# Patient Record
Sex: Male | Born: 1960 | Race: White | Hispanic: No | Marital: Married | Smoking: Never smoker
Health system: Southern US, Community
[De-identification: ages and names within clinical notes are randomized; demographics above are authoritative.]

## PROBLEM LIST (undated history)

## (undated) DIAGNOSIS — J45909 Unspecified asthma, uncomplicated: Secondary | ICD-10-CM

## (undated) DIAGNOSIS — K200 Eosinophilic esophagitis: Secondary | ICD-10-CM

## (undated) DIAGNOSIS — T7840XA Allergy, unspecified, initial encounter: Secondary | ICD-10-CM

## (undated) DIAGNOSIS — E349 Endocrine disorder, unspecified: Secondary | ICD-10-CM

## (undated) DIAGNOSIS — N529 Male erectile dysfunction, unspecified: Secondary | ICD-10-CM

## (undated) DIAGNOSIS — K219 Gastro-esophageal reflux disease without esophagitis: Secondary | ICD-10-CM

## (undated) DIAGNOSIS — T18128A Food in esophagus causing other injury, initial encounter: Secondary | ICD-10-CM

## (undated) DIAGNOSIS — J302 Other seasonal allergic rhinitis: Secondary | ICD-10-CM

## (undated) DIAGNOSIS — K222 Esophageal obstruction: Secondary | ICD-10-CM

## (undated) DIAGNOSIS — C439 Malignant melanoma of skin, unspecified: Secondary | ICD-10-CM

## (undated) DIAGNOSIS — K228 Other specified diseases of esophagus: Secondary | ICD-10-CM

## (undated) HISTORY — PX: HERNIA REPAIR: SHX51

## (undated) HISTORY — DX: Eosinophilic esophagitis: K20.0

## (undated) HISTORY — PX: TONSILLECTOMY: SUR1361

## (undated) HISTORY — DX: Gastro-esophageal reflux disease without esophagitis: K21.9

## (undated) HISTORY — DX: Malignant melanoma of skin, unspecified: C43.9

## (undated) HISTORY — PX: CARPAL TUNNEL RELEASE: SHX101

## (undated) HISTORY — DX: Allergy, unspecified, initial encounter: T78.40XA

## (undated) HISTORY — DX: Esophageal obstruction: K22.2

## (undated) HISTORY — PX: KNEE ARTHROCENTESIS: SUR44

## (undated) HISTORY — PX: NECK SURGERY: SHX720

## (undated) HISTORY — PX: KNEE ARTHROSCOPY: SUR90

## (undated) HISTORY — PX: NASAL SINUS SURGERY: SHX719

---

## 2013-06-18 HISTORY — PX: COLONOSCOPY: SHX174

## 2015-12-02 ENCOUNTER — Encounter (INDEPENDENT_AMBULATORY_CARE_PROVIDER_SITE_OTHER): Payer: BLUE CROSS/BLUE SHIELD | Admitting: Ophthalmology

## 2015-12-02 DIAGNOSIS — H43813 Vitreous degeneration, bilateral: Secondary | ICD-10-CM

## 2015-12-02 DIAGNOSIS — H2513 Age-related nuclear cataract, bilateral: Secondary | ICD-10-CM | POA: Diagnosis not present

## 2015-12-02 DIAGNOSIS — H33022 Retinal detachment with multiple breaks, left eye: Secondary | ICD-10-CM | POA: Diagnosis not present

## 2015-12-02 DIAGNOSIS — H4312 Vitreous hemorrhage, left eye: Secondary | ICD-10-CM

## 2015-12-30 ENCOUNTER — Ambulatory Visit (INDEPENDENT_AMBULATORY_CARE_PROVIDER_SITE_OTHER): Payer: BLUE CROSS/BLUE SHIELD | Admitting: Ophthalmology

## 2015-12-30 DIAGNOSIS — H33302 Unspecified retinal break, left eye: Secondary | ICD-10-CM

## 2016-05-02 ENCOUNTER — Ambulatory Visit (INDEPENDENT_AMBULATORY_CARE_PROVIDER_SITE_OTHER): Payer: BLUE CROSS/BLUE SHIELD | Admitting: Ophthalmology

## 2016-05-31 ENCOUNTER — Ambulatory Visit (INDEPENDENT_AMBULATORY_CARE_PROVIDER_SITE_OTHER): Payer: BLUE CROSS/BLUE SHIELD | Admitting: Ophthalmology

## 2016-06-20 ENCOUNTER — Ambulatory Visit (INDEPENDENT_AMBULATORY_CARE_PROVIDER_SITE_OTHER): Payer: BLUE CROSS/BLUE SHIELD | Admitting: Ophthalmology

## 2016-07-02 ENCOUNTER — Ambulatory Visit (INDEPENDENT_AMBULATORY_CARE_PROVIDER_SITE_OTHER): Payer: BLUE CROSS/BLUE SHIELD | Admitting: Ophthalmology

## 2016-07-02 DIAGNOSIS — H2513 Age-related nuclear cataract, bilateral: Secondary | ICD-10-CM

## 2016-07-02 DIAGNOSIS — H43813 Vitreous degeneration, bilateral: Secondary | ICD-10-CM

## 2016-07-02 DIAGNOSIS — H33302 Unspecified retinal break, left eye: Secondary | ICD-10-CM | POA: Diagnosis not present

## 2016-09-14 DIAGNOSIS — F419 Anxiety disorder, unspecified: Secondary | ICD-10-CM | POA: Diagnosis not present

## 2016-09-19 DIAGNOSIS — F419 Anxiety disorder, unspecified: Secondary | ICD-10-CM | POA: Diagnosis not present

## 2016-09-25 DIAGNOSIS — F419 Anxiety disorder, unspecified: Secondary | ICD-10-CM | POA: Diagnosis not present

## 2016-10-02 DIAGNOSIS — F419 Anxiety disorder, unspecified: Secondary | ICD-10-CM | POA: Diagnosis not present

## 2016-10-10 DIAGNOSIS — F411 Generalized anxiety disorder: Secondary | ICD-10-CM | POA: Diagnosis not present

## 2016-10-17 DIAGNOSIS — F411 Generalized anxiety disorder: Secondary | ICD-10-CM | POA: Diagnosis not present

## 2016-10-30 DIAGNOSIS — F411 Generalized anxiety disorder: Secondary | ICD-10-CM | POA: Diagnosis not present

## 2016-11-16 DIAGNOSIS — F411 Generalized anxiety disorder: Secondary | ICD-10-CM | POA: Diagnosis not present

## 2016-11-23 DIAGNOSIS — F411 Generalized anxiety disorder: Secondary | ICD-10-CM | POA: Diagnosis not present

## 2016-11-30 DIAGNOSIS — F411 Generalized anxiety disorder: Secondary | ICD-10-CM | POA: Diagnosis not present

## 2016-12-04 ENCOUNTER — Encounter (HOSPITAL_COMMUNITY): Payer: Self-pay | Admitting: Emergency Medicine

## 2016-12-04 ENCOUNTER — Encounter (HOSPITAL_COMMUNITY)
Admission: EM | Disposition: A | Discharge status: Home / Self Care | Payer: Self-pay | Source: Home / Self Care | Attending: Internal Medicine

## 2016-12-04 ENCOUNTER — Inpatient Hospital Stay (HOSPITAL_COMMUNITY)
Admission: EM | Admit: 2016-12-04 | Discharge: 2016-12-07 | DRG: 393 | Disposition: A | Discharge status: Home / Self Care | Payer: BLUE CROSS/BLUE SHIELD | Attending: Internal Medicine | Admitting: Internal Medicine

## 2016-12-04 DIAGNOSIS — K2289 Other specified disease of esophagus: Secondary | ICD-10-CM

## 2016-12-04 DIAGNOSIS — R0789 Other chest pain: Secondary | ICD-10-CM

## 2016-12-04 DIAGNOSIS — Z7951 Long term (current) use of inhaled steroids: Secondary | ICD-10-CM

## 2016-12-04 DIAGNOSIS — D72829 Elevated white blood cell count, unspecified: Secondary | ICD-10-CM | POA: Diagnosis present

## 2016-12-04 DIAGNOSIS — R1310 Dysphagia, unspecified: Secondary | ICD-10-CM

## 2016-12-04 DIAGNOSIS — T18128A Food in esophagus causing other injury, initial encounter: Principal | ICD-10-CM | POA: Diagnosis present

## 2016-12-04 DIAGNOSIS — W44F3XA Food entering into or through a natural orifice, initial encounter: Secondary | ICD-10-CM

## 2016-12-04 DIAGNOSIS — J45909 Unspecified asthma, uncomplicated: Secondary | ICD-10-CM | POA: Diagnosis present

## 2016-12-04 DIAGNOSIS — Z79899 Other long term (current) drug therapy: Secondary | ICD-10-CM

## 2016-12-04 DIAGNOSIS — Z87891 Personal history of nicotine dependence: Secondary | ICD-10-CM

## 2016-12-04 DIAGNOSIS — S27813A Laceration of esophagus (thoracic part), initial encounter: Secondary | ICD-10-CM | POA: Diagnosis present

## 2016-12-04 DIAGNOSIS — K219 Gastro-esophageal reflux disease without esophagitis: Secondary | ICD-10-CM | POA: Diagnosis present

## 2016-12-04 DIAGNOSIS — K2210 Ulcer of esophagus without bleeding: Secondary | ICD-10-CM | POA: Diagnosis not present

## 2016-12-04 DIAGNOSIS — J9811 Atelectasis: Secondary | ICD-10-CM | POA: Diagnosis not present

## 2016-12-04 DIAGNOSIS — R1110 Vomiting, unspecified: Secondary | ICD-10-CM | POA: Diagnosis not present

## 2016-12-04 DIAGNOSIS — K228 Other specified diseases of esophagus: Secondary | ICD-10-CM

## 2016-12-04 DIAGNOSIS — N529 Male erectile dysfunction, unspecified: Secondary | ICD-10-CM | POA: Diagnosis present

## 2016-12-04 DIAGNOSIS — E291 Testicular hypofunction: Secondary | ICD-10-CM | POA: Diagnosis present

## 2016-12-04 DIAGNOSIS — Z880 Allergy status to penicillin: Secondary | ICD-10-CM

## 2016-12-04 DIAGNOSIS — X58XXXA Exposure to other specified factors, initial encounter: Secondary | ICD-10-CM | POA: Diagnosis present

## 2016-12-04 DIAGNOSIS — Z8249 Family history of ischemic heart disease and other diseases of the circulatory system: Secondary | ICD-10-CM

## 2016-12-04 HISTORY — DX: Other seasonal allergic rhinitis: J30.2

## 2016-12-04 HISTORY — DX: Male erectile dysfunction, unspecified: N52.9

## 2016-12-04 HISTORY — PX: ESOPHAGOGASTRODUODENOSCOPY: SHX5428

## 2016-12-04 HISTORY — DX: Other specified diseases of esophagus: K22.8

## 2016-12-04 HISTORY — DX: Food in esophagus causing other injury, initial encounter: T18.128A

## 2016-12-04 HISTORY — DX: Endocrine disorder, unspecified: E34.9

## 2016-12-04 HISTORY — DX: Unspecified asthma, uncomplicated: J45.909

## 2016-12-04 HISTORY — DX: Food entering into or through a natural orifice, initial encounter: W44.F3XA

## 2016-12-04 HISTORY — DX: Other specified disease of esophagus: K22.89

## 2016-12-04 SURGERY — EGD (ESOPHAGOGASTRODUODENOSCOPY)
Anesthesia: Moderate Sedation

## 2016-12-04 MED ORDER — FENTANYL CITRATE (PF) 100 MCG/2ML IJ SOLN
INTRAMUSCULAR | Status: DC | PRN
  Administered 2016-12-04 (×3): 25 ug via INTRAVENOUS

## 2016-12-04 MED ORDER — MIDAZOLAM HCL 10 MG/2ML IJ SOLN
INTRAMUSCULAR | Status: DC | PRN
  Administered 2016-12-04 (×2): 2 mg via INTRAVENOUS
  Administered 2016-12-04: 1 mg via INTRAVENOUS

## 2016-12-04 MED ORDER — DIPHENHYDRAMINE HCL 50 MG/ML IJ SOLN
INTRAMUSCULAR | Status: AC
  Filled 2016-12-04: qty 1

## 2016-12-04 MED ORDER — ONDANSETRON HCL 4 MG/2ML IJ SOLN
4.0000 mg | Freq: Once | INTRAMUSCULAR | Status: AC
  Administered 2016-12-04: 4 mg via INTRAVENOUS
  Filled 2016-12-04: qty 2

## 2016-12-04 MED ORDER — NITROGLYCERIN 0.4 MG SL SUBL
0.4000 mg | SUBLINGUAL_TABLET | Freq: Once | SUBLINGUAL | Status: AC
  Administered 2016-12-04: 0.4 mg via SUBLINGUAL
  Filled 2016-12-04: qty 1

## 2016-12-04 MED ORDER — MIDAZOLAM HCL 5 MG/ML IJ SOLN
INTRAMUSCULAR | Status: AC
  Filled 2016-12-04: qty 2

## 2016-12-04 MED ORDER — HYDROMORPHONE HCL 1 MG/ML IJ SOLN
1.0000 mg | Freq: Once | INTRAMUSCULAR | Status: AC
  Administered 2016-12-04: 1 mg via INTRAVENOUS
  Filled 2016-12-04: qty 1

## 2016-12-04 MED ORDER — FENTANYL CITRATE (PF) 100 MCG/2ML IJ SOLN
INTRAMUSCULAR | Status: AC
  Filled 2016-12-04: qty 2

## 2016-12-04 MED ORDER — GLUCAGON HCL RDNA (DIAGNOSTIC) 1 MG IJ SOLR
1.0000 mg | Freq: Once | INTRAMUSCULAR | Status: AC
  Administered 2016-12-04: 1 mg via INTRAVENOUS
  Filled 2016-12-04: qty 1

## 2016-12-04 NOTE — ED Notes (Signed)
Pt resting quietly at this time

## 2016-12-04 NOTE — ED Notes (Signed)
Pt st's not feeling any better at this time.

## 2016-12-04 NOTE — H&P (Signed)
[facility] History and Physical  Consult from ED MD Dr. Rene Kocher Primary Care Physician:  No primary care provider on file.   Reason for Procedure:   food impaction of esophagus  Plan:    Upper GI endoscopy and remove food impaction question eosinophilic esophagitis as a culprit versus an occult stricture.The risks and benefits as well as alternatives of endoscopic procedure(s) have been discussed and reviewed. All questions answered. The patient agrees to proceed.      HPI: Marcus Watson is a 56 y.o. male in usual state of health until around 6:15 PM he was eating steak at the [employer], and it became lodged in his esophagus. No prior history of food impactions or dysphagia. Some occasional reflux. He did not feel well have a lot of pressure, somebody tried to do the Heimlich a few times but it didn't seem to work. He was having trouble handling his secretions. He came to the emergency room where nitroglycerin and glucagon did not help. When I saw me had persistent sensation of food sitting in his mid to lower sternal area. He was having some pain. He had regurgitated and spit up a little bit of blood at some point. No respiratory distress or fever. No abdominal pain. No back pain. He felt better if he were sitting as opposed to lying back caused lower sternal and epigastric pain. Past Medical History:  Diagnosis Date  . Erectile dysfunction   . Mild asthma   . Seasonal allergies   . Testosterone deficiency     Past Surgical History:  Procedure Laterality Date  . [place]OS[place]  2015   in [place]  . HERNIA REPAIR    . KNEE ARTHROCENTESIS    . TONSILLECTOMY      Prior to Admission medications   Medication Sig Start Date End Date Taking? Authorizing Provider  albuterol (PROVENTIL HFA;VENTOLIN HFA) 108 (90 Base) MCG/ACT inhaler Inhale 1 puff into the lungs as needed for wheezing.  12/28/15 12/27/16 Yes [provider]  budesonide (RHINOCORT AQUA) 32 MCG/ACT nasal  spray Place 1-2 sprays into the nose See admin instructions. Two sprays the morning and one and night 01/10/16  Yes [provider]  cetirizine (ZYRTEC) 10 MG tablet Take 10 mg by mouth at bedtime.    Yes [provider]  Cholecalciferol (VITAMIN D-1000 MAX ST) 1000 units tablet Take 1,000 Units by mouth daily.   Yes [provider]  glucosamine-chondroitin 500-400 MG tablet Take 1 tablet by mouth daily.   Yes [provider]  Ketotifen Fumarate ([employer] ALLERGY EYE DROPS OP) Apply 2 drops to eye as needed. Two drops in both eyes   Yes [provider]  sildenafil (REVATIO) 20 MG tablet Take 20 mg by mouth daily. 11/01/16  Yes [provider]  testosterone cypionate (DEPO-TESTOSTERONE) 200 MG/ML injection Inject 200 mg into the muscle See admin instructions. Every two weeks 09/24/16  Yes [provider]    No current facility-administered medications for this encounter.    Current Outpatient Prescriptions  Medication Sig Dispense Refill  . albuterol (PROVENTIL HFA;VENTOLIN HFA) 108 (90 Base) MCG/ACT inhaler Inhale 1 puff into the lungs as needed for wheezing.     . budesonide (RHINOCORT AQUA) 32 MCG/ACT nasal spray Place 1-2 sprays into the nose See admin instructions. Two sprays the morning and one and night    . cetirizine (ZYRTEC) 10 MG tablet Take 10 mg by mouth at bedtime.     . Cholecalciferol (VITAMIN D-1000 MAX ST) 1000  units tablet Take 1,000 Units by mouth daily.    [name] glucosamine-chondroitin 500-400 MG tablet Take 1 tablet by mouth daily.    [name] Ketotifen Fumarate ([employer] ALLERGY EYE DROPS OP) Apply 2 drops to eye as needed. Two drops in both eyes    . sildenafil (REVATIO) 20 MG tablet Take 20 mg by mouth daily.    [name] testosterone cypionate (DEPO-TESTOSTERONE) 200 MG/ML injection Inject 200 mg into the muscle See admin instructions. Every two weeks      Allergies as of 12/04/2016 - Review Complete 12/04/2016  Allergen Reaction Noted   . Penicillins Anaphylaxis 12/16/2014    History reviewed. No pertinent family history.  Social History   Social History  . Marital status: Unknown    Spouse name: N/A  . Number of children: N/A  . Years of education: N/A   Occupational History  . Not on file.   Social History Main Topics  . Smoking status: Never Smoker  . Smokeless tobacco: Former Systems developer  . Alcohol use Yes     Comment: occassional  . Drug use: No   Social History Narrative   Married - from [place] area - moved to [place] approx 2015 to work for [employer]    Review of Systems: Positive for seaonal allergies and some conjunctivitis All other review of systems negative except as mentioned in the HPI.  Physical Exam: Vital signs in last 24 hours: Temp:  [98.2 F (36.8 C)-98.9 F (37.2 C)] 98.9 F (37.2 C) (06/19 2208) Pulse Rate:  [65-95] 68 (06/19 2235) Resp:  [12-22] 12 (06/19 2235) BP: (111-163)/(74-133) 134/80 (06/19 2235) SpO2:  [92 %-98 %] 94 % (06/19 2235) Weight:  [210 lb (95.3 kg)] 210 lb (95.3 kg) (06/19 2208)   General:   Alert,  Well-developed, well-nourished, pleasant and cooperative in Mild discomfort  Lungs:  Clear throughout to auscultation.  No crepitus. Chest wall was not tender xiphoid nontender. Heart:  Regular rate and rhythm; no murmurs, clicks, rubs,  or gallops. Abdomen:  Soft, nontender and nondistended. Normal bowel sounds.   Neuro/Psych:  Alert and cooperative. Normal mood and affect. A and O x 3   @Stephanine Reas  Simonne Maffucci, MD, [facility] [phone] (pager) 12/04/2016 10:47 PM@

## 2016-12-04 NOTE — ED Triage Notes (Signed)
Per pt, eating steak at a restaurant, felt a piece get stuck. Pt's wife reports pt vomited "multiple" times. Waiter performed heimlich maneuver. Pt reported feeling better when EMS got there, had another episode of vomiting and feeling short of breath PTA.

## 2016-12-04 NOTE — ED Provider Notes (Signed)
[facility] DEPT Provider Note   CSN: [id number] Arrival date & time: 12/04/16  1909     History   Chief Complaint Chief Complaint  Patient presents with  . Choking    HPI Marcus Watson is a 56 y.o. male.  The history is provided by the patient, medical records and the spouse. No language interpreter was used.  Illness  This is a new problem. The current episode started less than 1 hour ago. The problem occurs constantly. The problem has not changed since onset.Pertinent negatives include no chest pain, no abdominal pain and no shortness of breath. Nothing aggravates the symptoms. Nothing relieves the symptoms. Treatments tried: abdominal thrusts. The treatment provided no relief.    Past Medical History:  Diagnosis Date  . Erectile dysfunction   . Esophageal laceration 12/04/2016  . Food impaction of esophagus 12/04/2016  . Mild asthma   . Seasonal allergies   . Testosterone deficiency     Patient Active Problem List   Diagnosis Date Noted  . Other chest pain   . Odynophagia   . Food impaction of esophagus 12/04/2016  . Esophageal laceration 12/04/2016    Past Surgical History:  Procedure Laterality Date  . [place]OSCOPY  2015   [place]  . ESOPHAGOGASTRODUODENOSCOPY N/A 12/04/2016   Procedure: ESOPHAGOGASTRODUODENOSCOPY (EGD);  Surgeon: [name], MD;  Location: [facility] ENDOSCOPY;  Service: Endoscopy;  Laterality: N/A;  . HERNIA REPAIR    . KNEE ARTHROCENTESIS    . TONSILLECTOMY         Home Medications    Prior to Admission medications   Medication Sig Start Date End Date Taking? Authorizing Provider  albuterol (PROVENTIL HFA;VENTOLIN HFA) 108 (90 Base) MCG/ACT inhaler Inhale 1 puff into the lungs as needed for wheezing.  12/28/15 12/27/16 Yes [provider]  budesonide (RHINOCORT AQUA) 32 MCG/ACT nasal spray Place 1-2 sprays into the nose See admin instructions. Two sprays the morning and one and night 01/10/16  Yes [provider]    cetirizine (ZYRTEC) 10 MG tablet Take 10 mg by mouth at bedtime.    Yes [provider]  Cholecalciferol (VITAMIN D-1000 MAX ST) 1000 units tablet Take 1,000 Units by mouth daily.   Yes [provider]  glucosamine-chondroitin 500-400 MG tablet Take 1 tablet by mouth daily.   Yes [provider]  Ketotifen Fumarate ([employer] ALLERGY EYE DROPS OP) Apply 2 drops to eye as needed. Two drops in both eyes   Yes [provider]  sildenafil (REVATIO) 20 MG tablet Take 20 mg by mouth daily. 11/01/16  Yes [provider]  testosterone cypionate (DEPO-TESTOSTERONE) 200 MG/ML injection Inject 200 mg into the muscle See admin instructions. Every two weeks 09/24/16  Yes [provider]    Family History Family History  Problem Relation Age of Onset  . Hypertension Other     Social History Social History  Substance Use Topics  . Smoking status: Never Smoker  . Smokeless tobacco: Former Systems developer  . Alcohol use Yes     Comment: occassional     Allergies   Penicillins   Review of Systems Review of Systems  Constitutional: Negative for chills and fever.  HENT: Negative for ear pain and sore throat.   Eyes: Negative for pain and visual disturbance.  Respiratory: Negative for cough and shortness of breath.   Cardiovascular: Negative for chest pain and palpitations.  Gastrointestinal: Positive for vomiting. Negative for abdominal pain.  Genitourinary: Negative for dysuria and hematuria.  Musculoskeletal: Negative  for arthralgias and back pain.  Skin: Negative for color change and rash.  Neurological: Negative for seizures and syncope.  All other systems reviewed and are negative.    Physical Exam Updated Vital Signs BP (!) 124/59 (BP Location: Right Arm)   Pulse 61   Temp 97.7 F (36.5 C) (Oral)   Resp 18   Ht 6\' 1"  (1.854 m)   Wt 95.3 kg (210 lb)   SpO2 96%   BMI 27.71 kg/m   Physical Exam  Constitutional: He appears well-developed. He  appears distressed (Appears uncomfortable and tense).  HENT:  Head: Normocephalic and atraumatic.  Maintaining airway  Eyes: Conjunctivae are normal.  Neck: Neck supple.  Cardiovascular: Normal rate and regular rhythm.   No murmur heard. Pulmonary/Chest: Effort normal and breath sounds normal. No respiratory distress.  Abdominal: Soft. There is no tenderness.  Musculoskeletal: He exhibits no edema.  Neurological: He is alert. No cranial nerve deficit. Coordination normal.  5/5 motor strength and intact sensation in all extremities. Intact bilateral finger-to-nose coordination  Skin: Skin is warm and dry.  Nursing note and vitals reviewed.    ED Treatments / Results  Labs (all labs ordered are listed, but only abnormal results are displayed) Labs Reviewed  CBC - Abnormal; Notable for the following:       Result Value   WBC 13.1 (*)    All other components within normal limits  BASIC METABOLIC PANEL - Abnormal; Notable for the following:    Glucose, Bld 119 (*)    Calcium 8.4 (*)    All other components within normal limits  BASIC METABOLIC PANEL - Abnormal; Notable for the following:    Glucose, Bld 101 (*)    Calcium 8.3 (*)    Anion gap 4 (*)    All other components within normal limits  GLUCOSE, CAPILLARY - Abnormal; Notable for the following:    Glucose-Capillary 105 (*)    All other components within normal limits  I-STAT CHEM 8, ED - Abnormal; Notable for the following:    Glucose, Bld 116 (*)    Calcium, Ion 1.10 (*)    All other components within normal limits  HIV ANTIBODY (ROUTINE TESTING)  CBC  CBC    EKG  EKG Interpretation  Date/Time:  Tuesday December 04 2016 19:44:14 EDT Ventricular Rate:  88 PR Interval:    QRS Duration: 90 QT Interval:  329 QTC Calculation: 398 R Axis:   63 Text Interpretation:  Sinus rhythm Borderline repolarization abnormality Confirmed by Thayer Jew [phone]) on 12/04/2016 10:56:29 PM       Radiology Ct Chest W  Contrast  Result Date: 12/05/2016 CLINICAL DATA:  Felt a piece of steak get stuck in esophagus. Vomiting and shortness of breath, acute onset. Initial encounter. EXAM: CT CHEST, ABDOMEN, AND PELVIS WITH CONTRAST TECHNIQUE: Multidetector CT imaging of the chest, abdomen and pelvis was performed following the standard protocol during bolus administration of intravenous contrast. CONTRAST:  13mL ISOVUE-300 IOPAMIDOL (ISOVUE-300) INJECTION 61% COMPARISON:  None. FINDINGS: CT CHEST FINDINGS Cardiovascular: The heart is normal in size. The thoracic aorta is grossly unremarkable. The great vessels are within normal limits. No calcific atherosclerotic disease is seen. Mediastinum/Nodes: There is mild wall thickening along the mid to distal esophagus, raising concern for esophagitis or acute inflammation due to vomiting. No mediastinal lymphadenopathy is seen. No pericardial effusion is identified. The visualized portions of the thyroid gland are grossly unremarkable. Lungs/Pleura: Mild bibasilar atelectasis is noted. No pleural effusion or pneumothorax is  seen. No masses are identified. Musculoskeletal: No acute osseous abnormalities are identified. The visualized musculature is unremarkable in appearance. CT ABDOMEN PELVIS FINDINGS Hepatobiliary: The liver is unremarkable in appearance. The gallbladder is unremarkable in appearance. The common bile duct remains normal in caliber. Pancreas: The pancreas is within normal limits. Spleen: The spleen is unremarkable in appearance. Adrenals/Urinary Tract: The adrenal glands are unremarkable in appearance. The kidneys are within normal limits. There is no evidence of hydronephrosis. No renal or ureteral stones are identified. Mild nonspecific perinephric stranding is noted bilaterally. Stomach/Bowel: The stomach is unremarkable in appearance. The small bowel is within normal limits. The appendix is normal in caliber, without evidence of appendicitis. The colon is unremarkable  in appearance. Vascular/Lymphatic: The abdominal aorta is unremarkable in appearance. The inferior vena cava is grossly unremarkable. No retroperitoneal lymphadenopathy is seen. No pelvic sidewall lymphadenopathy is identified. Reproductive: The bladder is mildly distended and grossly unremarkable. The prostate is enlarged, measuring 5.8 cm in transverse dimension. The patient is status post vasectomy. Other: No additional soft tissue abnormalities are seen. Musculoskeletal: No acute osseous abnormalities are identified. Multilevel vacuum phenomenon is noted along the lumbar spine. The visualized musculature is unremarkable in appearance. IMPRESSION: 1. Mild wall thickening along the mid to distal esophagus, raising concern for esophagitis or possibly acute inflammation secondary to recent vomiting. No foreign objects seen within the esophagus. 2. Mild bibasilar atelectasis noted.  Lungs otherwise clear. 3. Enlarged prostate noted. Electronically Signed   By: Garald Balding M.D.   On: 12/05/2016 01:22   Ct Abdomen Pelvis W Contrast  Result Date: 12/05/2016 CLINICAL DATA:  Felt a piece of steak get stuck in esophagus. Vomiting and shortness of breath, acute onset. Initial encounter. EXAM: CT CHEST, ABDOMEN, AND PELVIS WITH CONTRAST TECHNIQUE: Multidetector CT imaging of the chest, abdomen and pelvis was performed following the standard protocol during bolus administration of intravenous contrast. CONTRAST:  111mL ISOVUE-300 IOPAMIDOL (ISOVUE-300) INJECTION 61% COMPARISON:  None. FINDINGS: CT CHEST FINDINGS Cardiovascular: The heart is normal in size. The thoracic aorta is grossly unremarkable. The great vessels are within normal limits. No calcific atherosclerotic disease is seen. Mediastinum/Nodes: There is mild wall thickening along the mid to distal esophagus, raising concern for esophagitis or acute inflammation due to vomiting. No mediastinal lymphadenopathy is seen. No pericardial effusion is identified.  The visualized portions of the thyroid gland are grossly unremarkable. Lungs/Pleura: Mild bibasilar atelectasis is noted. No pleural effusion or pneumothorax is seen. No masses are identified. Musculoskeletal: No acute osseous abnormalities are identified. The visualized musculature is unremarkable in appearance. CT ABDOMEN PELVIS FINDINGS Hepatobiliary: The liver is unremarkable in appearance. The gallbladder is unremarkable in appearance. The common bile duct remains normal in caliber. Pancreas: The pancreas is within normal limits. Spleen: The spleen is unremarkable in appearance. Adrenals/Urinary Tract: The adrenal glands are unremarkable in appearance. The kidneys are within normal limits. There is no evidence of hydronephrosis. No renal or ureteral stones are identified. Mild nonspecific perinephric stranding is noted bilaterally. Stomach/Bowel: The stomach is unremarkable in appearance. The small bowel is within normal limits. The appendix is normal in caliber, without evidence of appendicitis. The colon is unremarkable in appearance. Vascular/Lymphatic: The abdominal aorta is unremarkable in appearance. The inferior vena cava is grossly unremarkable. No retroperitoneal lymphadenopathy is seen. No pelvic sidewall lymphadenopathy is identified. Reproductive: The bladder is mildly distended and grossly unremarkable. The prostate is enlarged, measuring 5.8 cm in transverse dimension. The patient is status post vasectomy. Other: No additional soft tissue  abnormalities are seen. Musculoskeletal: No acute osseous abnormalities are identified. Multilevel vacuum phenomenon is noted along the lumbar spine. The visualized musculature is unremarkable in appearance. IMPRESSION: 1. Mild wall thickening along the mid to distal esophagus, raising concern for esophagitis or possibly acute inflammation secondary to recent vomiting. No foreign objects seen within the esophagus. 2. Mild bibasilar atelectasis noted.  Lungs  otherwise clear. 3. Enlarged prostate noted. Electronically Signed   By: Garald Balding M.D.   On: 12/05/2016 01:22    Procedures Procedures (including critical care time)  Medications Ordered in ED Medications  albuterol (PROVENTIL) (2.5 MG/3ML) 0.083% nebulizer solution 3 mL (not administered)  fluticasone (FLONASE) 50 MCG/ACT nasal spray 1 spray (1 spray Each Nare Not Given 12/05/16 0918)  pantoprazole (PROTONIX) injection 40 mg (40 mg Intravenous Given 12/05/16 2127)  acetaminophen (TYLENOL) tablet 650 mg (not administered)    Or  acetaminophen (TYLENOL) suppository 650 mg (not administered)  ondansetron (ZOFRAN) tablet 4 mg (not administered)    Or  ondansetron (ZOFRAN) injection 4 mg (not administered)  dextrose 5 %-0.9 % sodium chloride infusion ( Intravenous New Bag/Given 12/05/16 1606)  HYDROmorphone (DILAUDID) injection 1 mg (1 mg Intravenous Given 12/05/16 2336)  sucralfate (CARAFATE) 1 GM/10ML suspension 1 g (1 g Oral Given 12/05/16 2336)  lidocaine (XYLOCAINE) 2 % viscous mouth solution 15 mL (15 mLs Mouth/Throat Given 12/05/16 2127)  gi cocktail (Maalox,Lidocaine,Donnatal) (30 mLs Oral Given 12/05/16 2127)  glucagon (human recombinant) (GLUCAGEN) injection 1 mg (1 mg Intravenous Given 12/04/16 2007)  HYDROmorphone (DILAUDID) injection 1 mg (1 mg Intravenous Given 12/04/16 2002)  ondansetron (ZOFRAN) injection 4 mg (4 mg Intravenous Given 12/04/16 2001)  nitroGLYCERIN (NITROSTAT) SL tablet 0.4 mg (0.4 mg Sublingual Given 12/04/16 2026)  iopamidol (ISOVUE-300) 61 % injection (100 mLs  Contrast Given 12/05/16 0049)  HYDROmorphone (DILAUDID) injection 1 mg (1 mg Intravenous Given 12/05/16 0329)     Initial Impression / Assessment and Plan / ED Course  I have reviewed the triage vital signs and the nursing notes.  Pertinent labs & imaging results that were available during my care of the patient were reviewed by me and considered in my medical decision making (see chart for  details).     56 year old male previously healthy who presents with suspected food bolus. Eating steak when developed choking episode, acute onset 15 min PTA. Had abdominal thrusts performed without significant improvement. Currently feels impaction sensation in lower esophageal/epigastric region. No previous esophageal issues or surgeries. No previous food bolus impaction.   AF, VSS. Appears in distress on exam and hunched forward in discomfort. Protecting airway. Lungs CTAB.  Zofran, Dilaudid, and Glucagon given without relief. GI consulted and took pt for emergent endoscopy. Endoscopy revealing intimal tear. Pt returned to ED for CT to examine for esophageal perforation.  No evidence of perforation on CT. Pt admitted for further monitoring.  Pt care d/w Dr. Jeneen Rinks  Final Clinical Impressions(s) / ED Diagnoses   Final diagnoses:  Food impaction of esophagus, initial encounter    New Prescriptions Current Discharge Medication List       Payton Emerald, MD 12/06/16 2637    Tanna Furry, MD 12/16/16 2336

## 2016-12-04 NOTE — Op Note (Signed)
[facility] Patient Name: Marcus Watson Procedure Date : 12/04/2016 MRN: [id number] Attending MD: [name] , MD Date of Birth: 08/21/1960 CSN: [id number] Age: 56 Admit Type: Inpatient Procedure:                Upper GI endoscopy Indications:              Foreign body in the esophagus Providers:                [name], MD, Laverta Baltimore, RN, Elspeth Cho, Technician, Alan Mulder, Technician Referring MD:              Medicines:                Fentanyl 75 micrograms IV, Midazolam 5 mg IV Complications:            No immediate complications. Estimated Blood Loss:     Estimated blood loss: none. Procedure:                Pre-Anesthesia Assessment:                           - Prior to the procedure, a History and Physical                            was performed, and patient medications and                            allergies were reviewed. The patient's tolerance of                            previous anesthesia was also reviewed. The risks                            and benefits of the procedure and the sedation                            options and risks were discussed with the patient.                            All questions were answered, and informed consent                            was obtained. Prior Anticoagulants: The patient has                            taken no previous anticoagulant or antiplatelet                            agents. ASA Grade Assessment: II - A patient with                            mild systemic disease. After reviewing the risks  and benefits, the patient was deemed in                            satisfactory condition to undergo the procedure.                           After obtaining informed consent, the endoscope was                            passed under direct vision. Throughout the                            procedure, the patient's blood pressure,  pulse, and                            oxygen saturations were monitored continuously. The                            Endoscope was introduced through the mouth, and                            advanced to the lower third of esophagus. The upper                            GI endoscopy was accomplished without difficulty.                            The patient tolerated the procedure well. Scope In: Scope Out: Findings:      One cratered and linear esophageal ulcer and stigmata of recent bleeding       was found in the distal esophagus. The lesion was 5 mm in largest       dimension. No food in esophagus - ? if it dropped with seddation as he       felt much better after initial dose of sedation      Food (residue) was found in the gastric body. Impression:               - Esophageal ulcer. Deep laceration from retching                            and vomiting -                           - Food (residue) in the stomach.                           - No specimens collected. Moderate Sedation:      Moderate (conscious) sedation was administered by the endoscopy nurse       and supervised by the endoscopist. The following parameters were       monitored: oxygen saturation, heart rate, blood pressure, respiratory       rate, EKG, adequacy of pulmonary ventilation, and response to care.       Total physician intraservice time was 10 minutes. Recommendation:           - Use Protonix (pantoprazole)  40 mg IV BID                            [duration].                           - NPO.                           - Perform CT scan (computed tomography) of the                            chest with contrast today. Evaluate for perforation                            - doubt but possible. Treat w/ abx if shows signs                            of perforation and consult CT surgery if so                           - Admit the patient to hospital ward for                            observation. we will f/u in  AM and see about                            starting diet of clears if CT and clinical                            situation ok                           Keep [facility] raised                           F/U me in office after dc                           - Patient has a contact number available for                            emergencies. The signs and symptoms of potential                            delayed complications were discussed with the                            patient. Return to normal activities tomorrow.                            Written discharge instructions were provided to the                            patient.                           -  Continue present medications once NPO status off Procedure Code(s):        --- Professional ---                           43200, Esophagoscopy, flexible, transoral;                            diagnostic, including collection of specimen(s) by                            brushing or washing, when performed (separate                            procedure)                           99152, Moderate sedation services provided by the                            same physician or other qualified health care                            professional performing the diagnostic or                            therapeutic service that the sedation supports,                            requiring the presence of an independent trained                            observer to assist in the monitoring of the                            patient's level of consciousness and physiological                            status; initial 15 minutes of intraservice time,                            patient age 49 years or older Diagnosis Code(s):        --- Professional ---                           K22.10, Ulcer of esophagus without bleeding                           T18.108A, Unspecified foreign body in esophagus                            causing other injury, initial  encounter CPT copyright 2016 [employer]. All rights reserved. The codes documented in this report are preliminary and upon coder review may  be revised to meet current compliance requirements. [name], MD 12/04/2016 11:04:17 PM This report has been signed electronically. Number of Addenda: 0

## 2016-12-04 NOTE — ED Notes (Signed)
Pt tolerated procedure well.  Wife at bedside.  Pt resting quietly at this time

## 2016-12-04 NOTE — ED Notes (Signed)
Consent signed for Esophagogastroduodenoscopy by pt after procedure explained to pt by Dr. Carlean Purl

## 2016-12-04 NOTE — OR Nursing (Signed)
25 mcg fentanyl wasted by Laverta Baltimore, RN.  Witnessed by Dr. Silvano Rusk.    Laverta Baltimore, RN

## 2016-12-04 NOTE — ED Notes (Signed)
Gastro doctor at bedside at this time

## 2016-12-05 ENCOUNTER — Emergency Department (HOSPITAL_COMMUNITY): Payer: BLUE CROSS/BLUE SHIELD

## 2016-12-05 ENCOUNTER — Encounter (HOSPITAL_COMMUNITY): Payer: Self-pay | Admitting: Internal Medicine

## 2016-12-05 DIAGNOSIS — R1310 Dysphagia, unspecified: Secondary | ICD-10-CM | POA: Diagnosis not present

## 2016-12-05 DIAGNOSIS — J45909 Unspecified asthma, uncomplicated: Secondary | ICD-10-CM | POA: Diagnosis present

## 2016-12-05 DIAGNOSIS — Z79899 Other long term (current) drug therapy: Secondary | ICD-10-CM | POA: Diagnosis not present

## 2016-12-05 DIAGNOSIS — N529 Male erectile dysfunction, unspecified: Secondary | ICD-10-CM | POA: Diagnosis present

## 2016-12-05 DIAGNOSIS — S27813A Laceration of esophagus (thoracic part), initial encounter: Secondary | ICD-10-CM | POA: Diagnosis present

## 2016-12-05 DIAGNOSIS — Z87891 Personal history of nicotine dependence: Secondary | ICD-10-CM | POA: Diagnosis not present

## 2016-12-05 DIAGNOSIS — K2210 Ulcer of esophagus without bleeding: Secondary | ICD-10-CM | POA: Diagnosis present

## 2016-12-05 DIAGNOSIS — E291 Testicular hypofunction: Secondary | ICD-10-CM | POA: Diagnosis present

## 2016-12-05 DIAGNOSIS — Z8249 Family history of ischemic heart disease and other diseases of the circulatory system: Secondary | ICD-10-CM | POA: Diagnosis not present

## 2016-12-05 DIAGNOSIS — T18128A Food in esophagus causing other injury, initial encounter: Secondary | ICD-10-CM | POA: Diagnosis not present

## 2016-12-05 DIAGNOSIS — K228 Other specified diseases of esophagus: Secondary | ICD-10-CM

## 2016-12-05 DIAGNOSIS — Z7951 Long term (current) use of inhaled steroids: Secondary | ICD-10-CM | POA: Diagnosis not present

## 2016-12-05 DIAGNOSIS — J9811 Atelectasis: Secondary | ICD-10-CM | POA: Diagnosis not present

## 2016-12-05 DIAGNOSIS — Z880 Allergy status to penicillin: Secondary | ICD-10-CM | POA: Diagnosis not present

## 2016-12-05 DIAGNOSIS — K219 Gastro-esophageal reflux disease without esophagitis: Secondary | ICD-10-CM | POA: Diagnosis present

## 2016-12-05 DIAGNOSIS — X58XXXA Exposure to other specified factors, initial encounter: Secondary | ICD-10-CM | POA: Diagnosis present

## 2016-12-05 DIAGNOSIS — R0789 Other chest pain: Secondary | ICD-10-CM | POA: Diagnosis not present

## 2016-12-05 DIAGNOSIS — R1110 Vomiting, unspecified: Secondary | ICD-10-CM | POA: Diagnosis not present

## 2016-12-05 DIAGNOSIS — D72829 Elevated white blood cell count, unspecified: Secondary | ICD-10-CM | POA: Diagnosis present

## 2016-12-05 LAB — CBC
HCT: 42.1 % (ref 39.0–52.0)
HCT: 44.1 % (ref 39.0–52.0)
Hemoglobin: 13.4 g/dL (ref 13.0–17.0)
Hemoglobin: 14.4 g/dL (ref 13.0–17.0)
MCH: 29.8 pg (ref 26.0–34.0)
MCH: 30.1 pg (ref 26.0–34.0)
MCHC: 31.8 g/dL (ref 30.0–36.0)
MCHC: 32.7 g/dL (ref 30.0–36.0)
MCV: 92.3 fL (ref 78.0–100.0)
MCV: 93.6 fL (ref 78.0–100.0)
Platelets: 249 10*3/uL (ref 150–400)
Platelets: 252 10*3/uL (ref 150–400)
RBC: 4.5 MIL/uL (ref 4.22–5.81)
RBC: 4.78 MIL/uL (ref 4.22–5.81)
RDW: 13.7 % (ref 11.5–15.5)
RDW: 13.8 % (ref 11.5–15.5)
WBC: 13.1 10*3/uL — ABNORMAL HIGH (ref 4.0–10.5)
WBC: 9.6 10*3/uL (ref 4.0–10.5)

## 2016-12-05 LAB — I-STAT CHEM 8, ED
BUN: 15 mg/dL (ref 6–20)
Calcium, Ion: 1.1 mmol/L — ABNORMAL LOW (ref 1.15–1.40)
Chloride: 105 mmol/L (ref 101–111)
Creatinine, Ser: 1 mg/dL (ref 0.61–1.24)
Glucose, Bld: 116 mg/dL — ABNORMAL HIGH (ref 65–99)
HCT: 41 % (ref 39.0–52.0)
Hemoglobin: 13.9 g/dL (ref 13.0–17.0)
Potassium: 4 mmol/L (ref 3.5–5.1)
Sodium: 140 mmol/L (ref 135–145)
TCO2: 24 mmol/L (ref 0–100)

## 2016-12-05 LAB — BASIC METABOLIC PANEL
Anion gap: 4 — ABNORMAL LOW (ref 5–15)
Anion gap: 5 (ref 5–15)
BUN: 11 mg/dL (ref 6–20)
BUN: 13 mg/dL (ref 6–20)
CO2: 24 mmol/L (ref 22–32)
CO2: 27 mmol/L (ref 22–32)
Calcium: 8.3 mg/dL — ABNORMAL LOW (ref 8.9–10.3)
Calcium: 8.4 mg/dL — ABNORMAL LOW (ref 8.9–10.3)
Chloride: 107 mmol/L (ref 101–111)
Chloride: 109 mmol/L (ref 101–111)
Creatinine, Ser: 1.09 mg/dL (ref 0.61–1.24)
Creatinine, Ser: 1.17 mg/dL (ref 0.61–1.24)
GFR calc Af Amer: >60 mL/min (ref >60)
GFR calc Af Amer: >60 mL/min (ref >60)
GFR calc non Af Amer: >60 mL/min (ref >60)
GFR calc non Af Amer: >60 mL/min (ref >60)
Glucose, Bld: 101 mg/dL — ABNORMAL HIGH (ref 65–99)
Glucose, Bld: 119 mg/dL — ABNORMAL HIGH (ref 65–99)
Potassium: 3.8 mmol/L (ref 3.5–5.1)
Potassium: 3.9 mmol/L (ref 3.5–5.1)
Sodium: 136 mmol/L (ref 135–145)
Sodium: 140 mmol/L (ref 135–145)

## 2016-12-05 LAB — GLUCOSE, CAPILLARY: Glucose-Capillary: 105 mg/dL — ABNORMAL HIGH (ref 65–99)

## 2016-12-05 LAB — HIV ANTIBODY (ROUTINE TESTING W REFLEX): HIV Screen 4th Generation wRfx: NONREACTIVE

## 2016-12-05 MED ORDER — ACETAMINOPHEN 325 MG PO TABS: 650.0000 mg | ORAL_TABLET | Freq: Four times a day (QID) | ORAL | Status: DC | PRN

## 2016-12-05 MED ORDER — LIDOCAINE VISCOUS 2 % MT SOLN
15.0000 mL | Freq: Three times a day (TID) | OROMUCOSAL | Status: DC
  Administered 2016-12-05 – 2016-12-07 (×8): 15 mL via OROMUCOSAL
  Filled 2016-12-05 (×8): qty 15

## 2016-12-05 MED ORDER — ACETAMINOPHEN 650 MG RE SUPP: 650.0000 mg | Freq: Four times a day (QID) | RECTAL | Status: DC | PRN

## 2016-12-05 MED ORDER — HYDROMORPHONE HCL 1 MG/ML IJ SOLN
1.0000 mg | INTRAMUSCULAR | Status: DC | PRN
  Administered 2016-12-05 – 2016-12-06 (×8): 1 mg via INTRAVENOUS
  Filled 2016-12-05 (×8): qty 1

## 2016-12-05 MED ORDER — SUCRALFATE 1 GM/10ML PO SUSP
1.0000 g | Freq: Four times a day (QID) | ORAL | Status: DC
  Administered 2016-12-05 – 2016-12-06 (×5): 1 g via ORAL
  Filled 2016-12-05 (×5): qty 10

## 2016-12-05 MED ORDER — HYDROMORPHONE HCL 1 MG/ML IJ SOLN
1.0000 mg | Freq: Once | INTRAMUSCULAR | Status: AC
  Administered 2016-12-05: 1 mg via INTRAVENOUS
  Filled 2016-12-05: qty 1

## 2016-12-05 MED ORDER — HYDROMORPHONE HCL 1 MG/ML IJ SOLN
1.0000 mg | INTRAMUSCULAR | Status: DC | PRN
  Administered 2016-12-05: 1 mg via INTRAVENOUS
  Filled 2016-12-05: qty 1

## 2016-12-05 MED ORDER — ONDANSETRON HCL 4 MG/2ML IJ SOLN: 4.0000 mg | Freq: Four times a day (QID) | INTRAMUSCULAR | Status: DC | PRN

## 2016-12-05 MED ORDER — PANTOPRAZOLE SODIUM 40 MG IV SOLR
40.0000 mg | Freq: Two times a day (BID) | INTRAVENOUS | Status: DC
  Administered 2016-12-05 – 2016-12-07 (×5): 40 mg via INTRAVENOUS
  Filled 2016-12-05 (×5): qty 40

## 2016-12-05 MED ORDER — IOPAMIDOL (ISOVUE-300) INJECTION 61%
INTRAVENOUS | Status: AC
  Administered 2016-12-05: 100 mL
  Filled 2016-12-05: qty 100

## 2016-12-05 MED ORDER — ALBUTEROL SULFATE (2.5 MG/3ML) 0.083% IN NEBU: 3.0000 mL | INHALATION_SOLUTION | RESPIRATORY_TRACT | Status: DC | PRN

## 2016-12-05 MED ORDER — FLUTICASONE PROPIONATE 50 MCG/ACT NA SUSP
1.0000 | Freq: Every day | NASAL | Status: DC
  Administered 2016-12-06 – 2016-12-07 (×2): 1 via NASAL
  Filled 2016-12-05: qty 16

## 2016-12-05 MED ORDER — GI COCKTAIL ~~LOC~~
30.0000 mL | Freq: Three times a day (TID) | ORAL | Status: DC
  Administered 2016-12-05 – 2016-12-07 (×7): 30 mL via ORAL
  Filled 2016-12-05 (×12): qty 30

## 2016-12-05 MED ORDER — DEXTROSE-NACL 5-0.9 % IV SOLN
INTRAVENOUS | Status: AC
  Administered 2016-12-05 (×2): via INTRAVENOUS

## 2016-12-05 MED ORDER — ONDANSETRON HCL 4 MG PO TABS: 4.0000 mg | ORAL_TABLET | Freq: Four times a day (QID) | ORAL | Status: DC | PRN

## 2016-12-05 NOTE — ED Notes (Signed)
Patient transported to CT 

## 2016-12-05 NOTE — ED Provider Notes (Signed)
Pt seen on arival. Reports choking on steak at restaurant. initailly dyspneic.Waiter gave heimlich. Pt felt better, but felt recurrent nausea and pain.  Pt without improvement after glucagon and SL NTG.   Pt seen by Dr. Carlean Purl and EGD performed. FB into stomach, but intimal esophageal tear noted. CT of chest and abdomen requested.  Pt clinically improved and handling secretions. Will undergo OBS at request of GI physician. Will pursue any abnormalities on chest/abd CT.   Tanna Furry, MD 12/05/16 [phone]

## 2016-12-05 NOTE — H&P (Signed)
History and Physical    Marcus Watson NOB:[id number] DOB: 28-Sep-1960 DOA: 12/04/2016  PCP: No primary care provider on file.  Patient coming from: Home.  Chief Complaint: Chest pain epigastric pain.  HPI: Marcus Watson is a 56 y.o. male with history of nasal allergies and asthma and erectile dysfunction had food stuck in his esophagus and had EGD and by Dr. Carlean Purl and the steak piece was pushed into the stomach. Following which patient had worsening pain and there was concern for laceration.   ED Course: In the ER patient had CT chest and abdomen which shows thickening of the distal esophagus concerning for inflammation and gastroenterologist at this time as requested admission for further observation.  Review of Systems: As per HPI, rest all negative.   Past Medical History:  Diagnosis Date  . Erectile dysfunction   . Esophageal laceration 12/04/2016  . Food impaction of esophagus 12/04/2016  . Mild asthma   . Seasonal allergies   . Testosterone deficiency     Past Surgical History:  Procedure Laterality Date  . [place]OS[place]  2015   in [place]  . HERNIA REPAIR    . KNEE ARTHROCENTESIS    . TONSILLECTOMY       reports that he has never smoked. He has quit using smokeless tobacco. He reports that he drinks alcohol. He reports that he does not use drugs.  Allergies  Allergen Reactions  . Penicillins Anaphylaxis    Has patient had a PCN reaction causing immediate rash, facial/tongue/throat swelling, SOB or lightheadedness with hypotension: No Has patient had a PCN reaction causing severe rash involving mucus membranes or skin necrosis: No Has patient had a PCN reaction that required hospitalization: No Has patient had a PCN reaction occurring within the last 10 years: No If all of the above answers are "NO", then may proceed with Cephalosporin use.    Family History  Problem Relation Age of Onset  . Hypertension Other     Prior to Admission medications   Medication  Sig Start Date End Date Taking? Authorizing Provider  albuterol (PROVENTIL HFA;VENTOLIN HFA) 108 (90 Base) MCG/ACT inhaler Inhale 1 puff into the lungs as needed for wheezing.  12/28/15 12/27/16 Yes [provider]  budesonide (RHINOCORT AQUA) 32 MCG/ACT nasal spray Place 1-2 sprays into the nose See admin instructions. Two sprays the morning and one and night 01/10/16  Yes [provider]  cetirizine (ZYRTEC) 10 MG tablet Take 10 mg by mouth at bedtime.    Yes [provider]  Cholecalciferol (VITAMIN D-1000 MAX ST) 1000 units tablet Take 1,000 Units by mouth daily.   Yes [provider]  glucosamine-chondroitin 500-400 MG tablet Take 1 tablet by mouth daily.   Yes [provider]  Ketotifen Fumarate ([employer] ALLERGY EYE DROPS OP) Apply 2 drops to eye as needed. Two drops in both eyes   Yes [provider]  sildenafil (REVATIO) 20 MG tablet Take 20 mg by mouth daily. 11/01/16  Yes [provider]  testosterone cypionate (DEPO-TESTOSTERONE) 200 MG/ML injection Inject 200 mg into the muscle See admin instructions. Every two weeks 09/24/16  Yes [provider]    Physical Exam: Vitals:   12/05/16 0015 12/05/16 0030 12/05/16 0045 12/05/16 0135  BP: 125/66 124/65 (!) 151/72 124/65  Pulse: 71 67 76 65  Resp: 20 16 14 17   Temp:      TempSrc:      SpO2: 98% 100% 100% 99%  Weight:      Height:  Constitutional: Moderately built and nourished. Vitals:   12/05/16 0015 12/05/16 0030 12/05/16 0045 12/05/16 0135  BP: 125/66 124/65 (!) 151/72 124/65  Pulse: 71 67 76 65  Resp: 20 16 14 17   Temp:      TempSrc:      SpO2: 98% 100% 100% 99%  Weight:      Height:       Eyes: Anicteric no pallor. ENMT: No discharge from the ears eyes nose and mouth. Neck: No neck rigidity no mass felt. Respiratory: No rhonchi or crepitations. Cardiovascular: S1-S2 no murmurs appreciated. Abdomen: Soft nontender bowel sounds  present. Musculoskeletal: No edema. No joint effusion. Skin: No rash or skin appears warm. Neurologic: Alert awake oriented to time place and person. Moves all extremities. Psychiatric: Appears normal. Normal affect.   Labs on Admission: I have personally reviewed following labs and imaging studies  CBC:  Recent Labs Lab 12/05/16 0005 12/05/16 0015  WBC 13.1*  --   HGB 14.4 13.9  HCT 44.1 41.0  MCV 92.3  --   PLT 249  --    Basic Metabolic Panel:  Recent Labs Lab 12/05/16 0005 12/05/16 0015  NA 136 140  K 3.9 4.0  CL 107 105  CO2 24  --   GLUCOSE 119* 116*  BUN 13 15  CREATININE 1.09 1.00  CALCIUM 8.4*  --    GFR: Estimated Creatinine Clearance: 93.2 mL/min (by C-G formula based on SCr of 1 mg/dL). Liver Function Tests: No results for input(s): AST, ALT, ALKPHOS, BILITOT, PROT, ALBUMIN in the last 168 hours. No results for input(s): LIPASE, AMYLASE in the last 168 hours. No results for input(s): AMMONIA in the last 168 hours. Coagulation Profile: No results for input(s): INR, PROTIME in the last 168 hours. Cardiac Enzymes: No results for input(s): CKTOTAL, CKMB, CKMBINDEX, TROPONINI in the last 168 hours. BNP (last 3 results) No results for input(s): PROBNP in the last 8760 hours. HbA1C: No results for input(s): HGBA1C in the last 72 hours. CBG: No results for input(s): GLUCAP in the last 168 hours. Lipid Profile: No results for input(s): CHOL, HDL, LDLCALC, TRIG, CHOLHDL, LDLDIRECT in the last 72 hours. Thyroid Function Tests: No results for input(s): TSH, T4TOTAL, FREET4, T3FREE, THYROIDAB in the last 72 hours. Anemia Panel: No results for input(s): VITAMINB12, FOLATE, FERRITIN, TIBC, IRON, RETICCTPCT in the last 72 hours. Urine analysis: No results found for: COLORURINE, APPEARANCEUR, LABSPEC, PHURINE, GLUCOSEU, HGBUR, BILIRUBINUR, KETONESUR, PROTEINUR, UROBILINOGEN, NITRITE, LEUKOCYTESUR Sepsis Labs: @LABRCNTIP (procalcitonin:4,lacticidven:4) )No  results found for this or any previous visit (from the past 240 hour(s)).   Radiological Exams on Admission: Ct Chest W Contrast  Result Date: 12/05/2016 CLINICAL DATA:  Felt a piece of steak get stuck in esophagus. Vomiting and shortness of breath, acute onset. Initial encounter. EXAM: CT CHEST, ABDOMEN, AND PELVIS WITH CONTRAST TECHNIQUE: Multidetector CT imaging of the chest, abdomen and pelvis was performed following the standard protocol during bolus administration of intravenous contrast. CONTRAST:  178mL ISOVUE-300 IOPAMIDOL (ISOVUE-300) INJECTION 61% COMPARISON:  None. FINDINGS: CT CHEST FINDINGS Cardiovascular: The heart is normal in size. The thoracic aorta is grossly unremarkable. The great vessels are within normal limits. No calcific atherosclerotic disease is seen. Mediastinum/Nodes: There is mild wall thickening along the mid to distal esophagus, raising concern for esophagitis or acute inflammation due to vomiting. No mediastinal lymphadenopathy is seen. No pericardial effusion is identified. The visualized portions of the thyroid gland are grossly unremarkable. Lungs/Pleura: Mild bibasilar atelectasis is noted. No pleural effusion or pneumothorax  is seen. No masses are identified. Musculoskeletal: No acute osseous abnormalities are identified. The visualized musculature is unremarkable in appearance. CT ABDOMEN PELVIS FINDINGS Hepatobiliary: The liver is unremarkable in appearance. The gallbladder is unremarkable in appearance. The common bile duct remains normal in caliber. Pancreas: The pancreas is within normal limits. Spleen: The spleen is unremarkable in appearance. Adrenals/Urinary Tract: The adrenal glands are unremarkable in appearance. The kidneys are within normal limits. There is no evidence of hydronephrosis. No renal or ureteral stones are identified. Mild nonspecific perinephric stranding is noted bilaterally. Stomach/Bowel: The stomach is unremarkable in appearance. The small  bowel is within normal limits. The appendix is normal in caliber, without evidence of appendicitis. The colon is unremarkable in appearance. Vascular/Lymphatic: The abdominal aorta is unremarkable in appearance. The inferior vena cava is grossly unremarkable. No retroperitoneal lymphadenopathy is seen. No pelvic sidewall lymphadenopathy is identified. Reproductive: The bladder is mildly distended and grossly unremarkable. The prostate is enlarged, measuring 5.8 cm in transverse dimension. The patient is status post vasectomy. Other: No additional soft tissue abnormalities are seen. Musculoskeletal: No acute osseous abnormalities are identified. Multilevel vacuum phenomenon is noted along the lumbar spine. The visualized musculature is unremarkable in appearance. IMPRESSION: 1. Mild wall thickening along the mid to distal esophagus, raising concern for esophagitis or possibly acute inflammation secondary to recent vomiting. No foreign objects seen within the esophagus. 2. Mild bibasilar atelectasis noted.  Lungs otherwise clear. 3. Enlarged prostate noted. Electronically Signed   By: Garald Balding M.D.   On: 12/05/2016 01:22   Ct Abdomen Pelvis W Contrast  Result Date: 12/05/2016 CLINICAL DATA:  Felt a piece of steak get stuck in esophagus. Vomiting and shortness of breath, acute onset. Initial encounter. EXAM: CT CHEST, ABDOMEN, AND PELVIS WITH CONTRAST TECHNIQUE: Multidetector CT imaging of the chest, abdomen and pelvis was performed following the standard protocol during bolus administration of intravenous contrast. CONTRAST:  17mL ISOVUE-300 IOPAMIDOL (ISOVUE-300) INJECTION 61% COMPARISON:  None. FINDINGS: CT CHEST FINDINGS Cardiovascular: The heart is normal in size. The thoracic aorta is grossly unremarkable. The great vessels are within normal limits. No calcific atherosclerotic disease is seen. Mediastinum/Nodes: There is mild wall thickening along the mid to distal esophagus, raising concern for  esophagitis or acute inflammation due to vomiting. No mediastinal lymphadenopathy is seen. No pericardial effusion is identified. The visualized portions of the thyroid gland are grossly unremarkable. Lungs/Pleura: Mild bibasilar atelectasis is noted. No pleural effusion or pneumothorax is seen. No masses are identified. Musculoskeletal: No acute osseous abnormalities are identified. The visualized musculature is unremarkable in appearance. CT ABDOMEN PELVIS FINDINGS Hepatobiliary: The liver is unremarkable in appearance. The gallbladder is unremarkable in appearance. The common bile duct remains normal in caliber. Pancreas: The pancreas is within normal limits. Spleen: The spleen is unremarkable in appearance. Adrenals/Urinary Tract: The adrenal glands are unremarkable in appearance. The kidneys are within normal limits. There is no evidence of hydronephrosis. No renal or ureteral stones are identified. Mild nonspecific perinephric stranding is noted bilaterally. Stomach/Bowel: The stomach is unremarkable in appearance. The small bowel is within normal limits. The appendix is normal in caliber, without evidence of appendicitis. The colon is unremarkable in appearance. Vascular/Lymphatic: The abdominal aorta is unremarkable in appearance. The inferior vena cava is grossly unremarkable. No retroperitoneal lymphadenopathy is seen. No pelvic sidewall lymphadenopathy is identified. Reproductive: The bladder is mildly distended and grossly unremarkable. The prostate is enlarged, measuring 5.8 cm in transverse dimension. The patient is status post vasectomy. Other: No additional soft  tissue abnormalities are seen. Musculoskeletal: No acute osseous abnormalities are identified. Multilevel vacuum phenomenon is noted along the lumbar spine. The visualized musculature is unremarkable in appearance. IMPRESSION: 1. Mild wall thickening along the mid to distal esophagus, raising concern for esophagitis or possibly acute  inflammation secondary to recent vomiting. No foreign objects seen within the esophagus. 2. Mild bibasilar atelectasis noted.  Lungs otherwise clear. 3. Enlarged prostate noted. Electronically Signed   By: Garald Balding M.D.   On: 12/05/2016 01:22     Assessment/Plan Principal Problem:   Food impaction of esophagus Active Problems:   Esophageal laceration    1. Food impaction status post removal with pain concerning for laceration in the esophagus - at this time patient is kept nothing by mouth and IV Protonix. Pain relief medications and further recommendations per gastroenterologist. 2. History of allergies and nasal spray. 3. History of erectile dysfunction.   DVT prophylaxis: SCDs. Code Status: Full code.  Family Communication: Patient's wife.  Disposition Plan: Home.  Consults called: Gastroenterologist.  Admission status: Observation.    Rise Patience MD [facility] Pager [phone].  If 7PM-7AM, please contact night-coverage [email or web address] Password [email or web address]  12/05/2016, 2:03 AM

## 2016-12-05 NOTE — Progress Notes (Signed)
Pt seen and examined at bedside, admitted after midnight. Please see earlier admission not by Dr. Hal Hope. Pt admitted after food impaction. EGD performed and intimal esophageal tear noted. GI following ,will follow up on recommendations. For now supportive care.   Faye Ramsay, MD  [facility] Pager [phone]  If 7PM-7AM, please contact night-coverage [email or web address] Password [email or web address]

## 2016-12-05 NOTE — Progress Notes (Signed)
   Telephone f/u - having severe chest pain when swallows secretions. VSS AF  Asking for more frequent Dilaudid.  Chest CT abd/pelvic CT no signs of perforation.  My partner will see him later today and decide next steps but I did increase Dilaudid to q2.  [name], MD, [facility] [phone] (pager) [phone] after 5 PM, weekends and holidays  12/05/2016 7:45 AM

## 2016-12-05 NOTE — Progress Notes (Signed)
Patient ID: Marcus Watson, male   DOB: 12/19/1960, 56 y.o.   MRN: [id number]    Progress Note   Subjective   Sitting up in bed, hurting in lower chest with swallowing, movement- no SOB, no abdominal pain. Dilaudid helping   Objective   Vital signs in last 24 hours: Temp:  [98 F (36.7 C)-98.9 F (37.2 C)] 98 F (36.7 C) (06/20 0303) Pulse Rate:  [60-95] 63 (06/20 0303) Resp:  [12-22] 19 (06/20 0303) BP: (108-163)/(59-133) 117/63 (06/20 0303) SpO2:  [92 %-100 %] 99 % (06/20 0303) Weight:  [210 lb (95.3 kg)] 210 lb (95.3 kg) (06/19 2208) Last BM Date: 12/04/16 General:    white male in NAD, uncomfortable Heart:  Regular rate and rhythm; no murmurs Lungs: Respirations even and unlabored, lungs CTA bilaterally Abdomen:  Soft, nontender and nondistended. Normal bowel sounds. Extremities:  Without edema. Neurologic:  Alert and oriented,  grossly normal neurologically. Psych:  Cooperative. Normal mood and affect.  Intake/Output from previous day: No intake/output data recorded. Intake/Output this shift: No intake/output data recorded.  Lab Results:  Recent Labs  12/05/16 0005 12/05/16 0015 12/05/16 0647  WBC 13.1*  --  9.6  HGB 14.4 13.9 13.4  HCT 44.1 41.0 42.1  PLT 249  --  252   BMET  Recent Labs  12/05/16 0005 12/05/16 0015 12/05/16 0647  NA 136 140 140  K 3.9 4.0 3.8  CL 107 105 109  CO2 24  --  27  GLUCOSE 119* 116* 101*  BUN 13 15 11   CREATININE 1.09 1.00 1.17  CALCIUM 8.4*  --  8.3*   LFT No results for input(s): PROT, ALBUMIN, AST, ALT, ALKPHOS, BILITOT, BILIDIR, IBILI in the last 72 hours. PT/INR No results for input(s): LABPROT, INR in the last 72 hours.  Studies/Results: Ct Chest W Contrast  Result Date: 12/05/2016 CLINICAL DATA:  Felt a piece of steak get stuck in esophagus. Vomiting and shortness of breath, acute onset. Initial encounter. EXAM: CT CHEST, ABDOMEN, AND PELVIS WITH CONTRAST TECHNIQUE: Multidetector CT imaging of the chest,  abdomen and pelvis was performed following the standard protocol during bolus administration of intravenous contrast. CONTRAST:  151mL ISOVUE-300 IOPAMIDOL (ISOVUE-300) INJECTION 61% COMPARISON:  None. FINDINGS: CT CHEST FINDINGS Cardiovascular: The heart is normal in size. The thoracic aorta is grossly unremarkable. The great vessels are within normal limits. No calcific atherosclerotic disease is seen. Mediastinum/Nodes: There is mild wall thickening along the mid to distal esophagus, raising concern for esophagitis or acute inflammation due to vomiting. No mediastinal lymphadenopathy is seen. No pericardial effusion is identified. The visualized portions of the thyroid gland are grossly unremarkable. Lungs/Pleura: Mild bibasilar atelectasis is noted. No pleural effusion or pneumothorax is seen. No masses are identified. Musculoskeletal: No acute osseous abnormalities are identified. The visualized musculature is unremarkable in appearance. CT ABDOMEN PELVIS FINDINGS Hepatobiliary: The liver is unremarkable in appearance. The gallbladder is unremarkable in appearance. The common bile duct remains normal in caliber. Pancreas: The pancreas is within normal limits. Spleen: The spleen is unremarkable in appearance. Adrenals/Urinary Tract: The adrenal glands are unremarkable in appearance. The kidneys are within normal limits. There is no evidence of hydronephrosis. No renal or ureteral stones are identified. Mild nonspecific perinephric stranding is noted bilaterally. Stomach/Bowel: The stomach is unremarkable in appearance. The small bowel is within normal limits. The appendix is normal in caliber, without evidence of appendicitis. The colon is unremarkable in appearance. Vascular/Lymphatic: The abdominal aorta is unremarkable in appearance. The inferior vena cava  is grossly unremarkable. No retroperitoneal lymphadenopathy is seen. No pelvic sidewall lymphadenopathy is identified. Reproductive: The bladder is mildly  distended and grossly unremarkable. The prostate is enlarged, measuring 5.8 cm in transverse dimension. The patient is status post vasectomy. Other: No additional soft tissue abnormalities are seen. Musculoskeletal: No acute osseous abnormalities are identified. Multilevel vacuum phenomenon is noted along the lumbar spine. The visualized musculature is unremarkable in appearance. IMPRESSION: 1. Mild wall thickening along the mid to distal esophagus, raising concern for esophagitis or possibly acute inflammation secondary to recent vomiting. No foreign objects seen within the esophagus. 2. Mild bibasilar atelectasis noted.  Lungs otherwise clear. 3. Enlarged prostate noted. Electronically Signed   By: Garald Balding M.D.   On: 12/05/2016 01:22   Ct Abdomen Pelvis W Contrast  Result Date: 12/05/2016 CLINICAL DATA:  Felt a piece of steak get stuck in esophagus. Vomiting and shortness of breath, acute onset. Initial encounter. EXAM: CT CHEST, ABDOMEN, AND PELVIS WITH CONTRAST TECHNIQUE: Multidetector CT imaging of the chest, abdomen and pelvis was performed following the standard protocol during bolus administration of intravenous contrast. CONTRAST:  174mL ISOVUE-300 IOPAMIDOL (ISOVUE-300) INJECTION 61% COMPARISON:  None. FINDINGS: CT CHEST FINDINGS Cardiovascular: The heart is normal in size. The thoracic aorta is grossly unremarkable. The great vessels are within normal limits. No calcific atherosclerotic disease is seen. Mediastinum/Nodes: There is mild wall thickening along the mid to distal esophagus, raising concern for esophagitis or acute inflammation due to vomiting. No mediastinal lymphadenopathy is seen. No pericardial effusion is identified. The visualized portions of the thyroid gland are grossly unremarkable. Lungs/Pleura: Mild bibasilar atelectasis is noted. No pleural effusion or pneumothorax is seen. No masses are identified. Musculoskeletal: No acute osseous abnormalities are identified. The  visualized musculature is unremarkable in appearance. CT ABDOMEN PELVIS FINDINGS Hepatobiliary: The liver is unremarkable in appearance. The gallbladder is unremarkable in appearance. The common bile duct remains normal in caliber. Pancreas: The pancreas is within normal limits. Spleen: The spleen is unremarkable in appearance. Adrenals/Urinary Tract: The adrenal glands are unremarkable in appearance. The kidneys are within normal limits. There is no evidence of hydronephrosis. No renal or ureteral stones are identified. Mild nonspecific perinephric stranding is noted bilaterally. Stomach/Bowel: The stomach is unremarkable in appearance. The small bowel is within normal limits. The appendix is normal in caliber, without evidence of appendicitis. The colon is unremarkable in appearance. Vascular/Lymphatic: The abdominal aorta is unremarkable in appearance. The inferior vena cava is grossly unremarkable. No retroperitoneal lymphadenopathy is seen. No pelvic sidewall lymphadenopathy is identified. Reproductive: The bladder is mildly distended and grossly unremarkable. The prostate is enlarged, measuring 5.8 cm in transverse dimension. The patient is status post vasectomy. Other: No additional soft tissue abnormalities are seen. Musculoskeletal: No acute osseous abnormalities are identified. Multilevel vacuum phenomenon is noted along the lumbar spine. The visualized musculature is unremarkable in appearance. IMPRESSION: 1. Mild wall thickening along the mid to distal esophagus, raising concern for esophagitis or possibly acute inflammation secondary to recent vomiting. No foreign objects seen within the esophagus. 2. Mild bibasilar atelectasis noted.  Lungs otherwise clear. 3. Enlarged prostate noted. Electronically Signed   By: Garald Balding M.D.   On: 12/05/2016 01:22       Assessment / Plan:    56 yo WM with acute esophageal food impaction-complicated by deep distal esophageal laceration about 5 mm in length   Secondary to vomiting /retching Fortunately CT chest /abd /pelvis  Show no evidence of perforation CBC normal  Plan:  Continue IV fluids IV PPI BID Elevate HOB NPO except ice chips today - do not advance diet until cleared by GI Will start Carafate susp 1 gm QID Start GI cocktail (with lidocaine ) TID  Continue Dilaudid  q 2-3 hours prn  Hopefully will see significant improvement in next 48 hours CBC in am  Principal Problem:   Food impaction of esophagus Active Problems:   Esophageal laceration    LOS: 0 days   Marcus Watson  12/05/2016, 9:55 AM     Attending physician's note   I have taken an interval history, reviewed the chart and examined the patient. I agree with the Advanced Practitioner's note, impression and recommendations. Deep esophageal laceration with chest pain and odynophagia. Mgmt as outlined above. ICe ships as tolerated. Advised pt to stop carafate and GI cocktail if they worsens his symptoms.   Marcus Edward, MD [facility] [phone] Mon-Fri 8a-5p [phone] after 5p, weekends, holidays

## 2016-12-06 LAB — BASIC METABOLIC PANEL
Anion gap: 6 (ref 5–15)
BUN: 7 mg/dL (ref 6–20)
CO2: 27 mmol/L (ref 22–32)
Calcium: 8.4 mg/dL — ABNORMAL LOW (ref 8.9–10.3)
Chloride: 107 mmol/L (ref 101–111)
Creatinine, Ser: 0.97 mg/dL (ref 0.61–1.24)
GFR calc Af Amer: >60 mL/min (ref >60)
GFR calc non Af Amer: >60 mL/min (ref >60)
Glucose, Bld: 113 mg/dL — ABNORMAL HIGH (ref 65–99)
Potassium: 3.7 mmol/L (ref 3.5–5.1)
Sodium: 140 mmol/L (ref 135–145)

## 2016-12-06 LAB — CBC
HCT: 41.3 % (ref 39.0–52.0)
Hemoglobin: 13.3 g/dL (ref 13.0–17.0)
MCH: 30.4 pg (ref 26.0–34.0)
MCHC: 32.2 g/dL (ref 30.0–36.0)
MCV: 94.5 fL (ref 78.0–100.0)
Platelets: 216 10*3/uL (ref 150–400)
RBC: 4.37 MIL/uL (ref 4.22–5.81)
RDW: 13.6 % (ref 11.5–15.5)
WBC: 7.3 10*3/uL (ref 4.0–10.5)

## 2016-12-06 LAB — GLUCOSE, CAPILLARY
Glucose-Capillary: 86 mg/dL (ref 65–99)
Glucose-Capillary: 131 mg/dL — ABNORMAL HIGH (ref 65–99)

## 2016-12-06 MED ORDER — SUCRALFATE 1 GM/10ML PO SUSP
1.0000 g | Freq: Three times a day (TID) | ORAL | Status: DC
  Administered 2016-12-06 – 2016-12-07 (×5): 1 g via ORAL
  Filled 2016-12-06 (×5): qty 10

## 2016-12-06 MED ORDER — TRAMADOL HCL 50 MG PO TABS
100.0000 mg | ORAL_TABLET | Freq: Four times a day (QID) | ORAL | Status: DC | PRN
  Administered 2016-12-07 (×2): 100 mg via ORAL
  Filled 2016-12-06 (×2): qty 2

## 2016-12-06 MED ORDER — KCL IN DEXTROSE-NACL 10-5-0.45 MEQ/L-%-% IV SOLN
INTRAVENOUS | Status: DC
  Administered 2016-12-06 – 2016-12-07 (×4): via INTRAVENOUS
  Filled 2016-12-06 (×7): qty 1000

## 2016-12-06 NOTE — Progress Notes (Addendum)
Patient ID: Marcus Watson, male   DOB: [birth year], 56 y.o.   MRN: [id number]    PROGRESS NOTE    Oskar Cretella  QJJ:[id number] DOB: March 06, 1961 DOA: 12/04/2016  PCP: Patient, No Pcp Per   Outpatient Specialists:   Brief Narrative:  56 y.o. male with history of nasal allergies and asthma and erectile dysfunction had food stuck in his esophagus, had EGD by Dr. Carlean Purl and the steak piece was pushed into the stomach, following which patient had worsening pain and there was concern for laceration.   Assessment & Plan: Esophageal food impaction - complicated by deep distal esophageal laceration about 5 mm in length in the setting of vomiting and retching  - no evidence of perforation on CT chest   - per GI, keep on IVF for now, elevated HOB - GI to advance diet if deemed appropriate  - keep on IV PPI BID - Carafate susp 1 gm QID   DVT prophylaxis: SCD's Code Status: Full  Family Communication: Patient at bedside  Disposition Plan: Home when GI team clears   Consultants:   GI  Procedures:   None  Antimicrobials:   None  Subjective: Pt still with mild soreness in the mid chest area but says overall better.   Objective: Vitals:   12/05/16 0303 12/05/16 1300 12/05/16 2150 12/06/16 0516  BP: 117/63 114/62 (!) 124/59 119/67  Pulse: 63 (!) 56 61 (!) 58  Resp: 19 19 18 18   Temp: 98 F (36.7 C) 98.6 F (37 C) 97.7 F (36.5 C) 98.5 F (36.9 C)  TempSrc: Oral Oral Oral Oral  SpO2: 99% 94% 96% 99%  Weight:      Height:        Intake/Output Summary (Last 24 hours) at 12/06/16 1129 Last data filed at 12/06/16 0516  Gross per 24 hour  Intake          1341.25 ml  Output                0 ml  Net          1341.25 ml   Filed Weights   12/04/16 1920 12/04/16 2208  Weight: 95.3 kg (210 lb) 95.3 kg (210 lb)    Examination:  General exam: Appears calm and comfortable  Respiratory system: Clear to auscultation. Respiratory effort normal. Cardiovascular system: S1 &  S2 heard, RRR. No JVD, murmurs, rubs, gallops or clicks. No pedal edema. Gastrointestinal system: Abdomen is nondistended, soft and nontender. No organomegaly or masses felt. Normal bowel sounds heard. Central nervous system: Alert and oriented. No focal neurological deficits. Extremities: Symmetric 5 x 5 power. Skin: No rashes, lesions or ulcers Psychiatry: Judgement and insight appear normal. Mood & affect appropriate.    Data Reviewed: I have personally reviewed following labs and imaging studies  CBC:  Recent Labs Lab 12/05/16 0005 12/05/16 0015 12/05/16 0647 12/06/16 0904  WBC 13.1*  --  9.6 7.3  HGB 14.4 13.9 13.4 13.3  HCT 44.1 41.0 42.1 41.3  MCV 92.3  --  93.6 94.5  PLT 249  --  252 481   Basic Metabolic Panel:  Recent Labs Lab 12/05/16 0005 12/05/16 0015 12/05/16 0647  NA 136 140 140  K 3.9 4.0 3.8  CL 107 105 109  CO2 24  --  27  GLUCOSE 119* 116* 101*  BUN 13 15 11   CREATININE 1.09 1.00 1.17  CALCIUM 8.4*  --  8.3*   CBG:  Recent Labs Lab 12/05/16 2355 12/06/16 0737  GLUCAP 105* 86   Radiology Studies: Ct Chest W Contrast  Result Date: 12/05/2016 CLINICAL DATA:  Felt a piece of steak get stuck in esophagus. Vomiting and shortness of breath, acute onset. Initial encounter. EXAM: CT CHEST, ABDOMEN, AND PELVIS WITH CONTRAST TECHNIQUE: Multidetector CT imaging of the chest, abdomen and pelvis was performed following the standard protocol during bolus administration of intravenous contrast. CONTRAST:  138mL ISOVUE-300 IOPAMIDOL (ISOVUE-300) INJECTION 61% COMPARISON:  None. FINDINGS: CT CHEST FINDINGS Cardiovascular: The heart is normal in size. The thoracic aorta is grossly unremarkable. The great vessels are within normal limits. No calcific atherosclerotic disease is seen. Mediastinum/Nodes: There is mild wall thickening along the mid to distal esophagus, raising concern for esophagitis or acute inflammation due to vomiting. No mediastinal lymphadenopathy  is seen. No pericardial effusion is identified. The visualized portions of the thyroid gland are grossly unremarkable. Lungs/Pleura: Mild bibasilar atelectasis is noted. No pleural effusion or pneumothorax is seen. No masses are identified. Musculoskeletal: No acute osseous abnormalities are identified. The visualized musculature is unremarkable in appearance. CT ABDOMEN PELVIS FINDINGS Hepatobiliary: The liver is unremarkable in appearance. The gallbladder is unremarkable in appearance. The common bile duct remains normal in caliber. Pancreas: The pancreas is within normal limits. Spleen: The spleen is unremarkable in appearance. Adrenals/Urinary Tract: The adrenal glands are unremarkable in appearance. The kidneys are within normal limits. There is no evidence of hydronephrosis. No renal or ureteral stones are identified. Mild nonspecific perinephric stranding is noted bilaterally. Stomach/Bowel: The stomach is unremarkable in appearance. The small bowel is within normal limits. The appendix is normal in caliber, without evidence of appendicitis. The colon is unremarkable in appearance. Vascular/Lymphatic: The abdominal aorta is unremarkable in appearance. The inferior vena cava is grossly unremarkable. No retroperitoneal lymphadenopathy is seen. No pelvic sidewall lymphadenopathy is identified. Reproductive: The bladder is mildly distended and grossly unremarkable. The prostate is enlarged, measuring 5.8 cm in transverse dimension. The patient is status post vasectomy. Other: No additional soft tissue abnormalities are seen. Musculoskeletal: No acute osseous abnormalities are identified. Multilevel vacuum phenomenon is noted along the lumbar spine. The visualized musculature is unremarkable in appearance. IMPRESSION: 1. Mild wall thickening along the mid to distal esophagus, raising concern for esophagitis or possibly acute inflammation secondary to recent vomiting. No foreign objects seen within the esophagus.  2. Mild bibasilar atelectasis noted.  Lungs otherwise clear. 3. Enlarged prostate noted. Electronically Signed   By: Garald Balding M.D.   On: 12/05/2016 01:22   Ct Abdomen Pelvis W Contrast  Result Date: 12/05/2016 CLINICAL DATA:  Felt a piece of steak get stuck in esophagus. Vomiting and shortness of breath, acute onset. Initial encounter. EXAM: CT CHEST, ABDOMEN, AND PELVIS WITH CONTRAST TECHNIQUE: Multidetector CT imaging of the chest, abdomen and pelvis was performed following the standard protocol during bolus administration of intravenous contrast. CONTRAST:  156mL ISOVUE-300 IOPAMIDOL (ISOVUE-300) INJECTION 61% COMPARISON:  None. FINDINGS: CT CHEST FINDINGS Cardiovascular: The heart is normal in size. The thoracic aorta is grossly unremarkable. The great vessels are within normal limits. No calcific atherosclerotic disease is seen. Mediastinum/Nodes: There is mild wall thickening along the mid to distal esophagus, raising concern for esophagitis or acute inflammation due to vomiting. No mediastinal lymphadenopathy is seen. No pericardial effusion is identified. The visualized portions of the thyroid gland are grossly unremarkable. Lungs/Pleura: Mild bibasilar atelectasis is noted. No pleural effusion or pneumothorax is seen. No masses are identified. Musculoskeletal: No acute osseous abnormalities are identified. The visualized musculature is unremarkable in  appearance. CT ABDOMEN PELVIS FINDINGS Hepatobiliary: The liver is unremarkable in appearance. The gallbladder is unremarkable in appearance. The common bile duct remains normal in caliber. Pancreas: The pancreas is within normal limits. Spleen: The spleen is unremarkable in appearance. Adrenals/Urinary Tract: The adrenal glands are unremarkable in appearance. The kidneys are within normal limits. There is no evidence of hydronephrosis. No renal or ureteral stones are identified. Mild nonspecific perinephric stranding is noted bilaterally.  Stomach/Bowel: The stomach is unremarkable in appearance. The small bowel is within normal limits. The appendix is normal in caliber, without evidence of appendicitis. The colon is unremarkable in appearance. Vascular/Lymphatic: The abdominal aorta is unremarkable in appearance. The inferior vena cava is grossly unremarkable. No retroperitoneal lymphadenopathy is seen. No pelvic sidewall lymphadenopathy is identified. Reproductive: The bladder is mildly distended and grossly unremarkable. The prostate is enlarged, measuring 5.8 cm in transverse dimension. The patient is status post vasectomy. Other: No additional soft tissue abnormalities are seen. Musculoskeletal: No acute osseous abnormalities are identified. Multilevel vacuum phenomenon is noted along the lumbar spine. The visualized musculature is unremarkable in appearance. IMPRESSION: 1. Mild wall thickening along the mid to distal esophagus, raising concern for esophagitis or possibly acute inflammation secondary to recent vomiting. No foreign objects seen within the esophagus. 2. Mild bibasilar atelectasis noted.  Lungs otherwise clear. 3. Enlarged prostate noted. Electronically Signed   By: Garald Balding M.D.   On: 12/05/2016 01:22   Scheduled Meds: . fluticasone  1 spray Each Nare Daily  . gi cocktail  30 mL Oral TID  . lidocaine  15 mL Mouth/Throat TID  . pantoprazole (PROTONIX) IV  40 mg Intravenous Q12H  . sucralfate  1 g Oral Q6H   Continuous Infusions: . dextrose 5 % and 0.45 % NaCl with KCl 10 mEq/L 125 mL/hr at 12/06/16 1006    LOS: 1 day   Time spent: 25 minutes   Faye Ramsay, MD [facility] Pager [phone]  If 7PM-7AM, please contact night-coverage [email or web address] Password [email or web address] 12/06/2016, 11:29 AM

## 2016-12-06 NOTE — Progress Notes (Addendum)
Daily Rounding Note  12/06/2016, 9:17 AM  LOS: 1 day   SUBJECTIVE:   Chief complaint: chest, esophageal pain.  Persists, triggers include twisting, ice chips.  Pain at rest is improved.      Used 8 Mg Dilaudid yesterday, 1 mg so far today and it helps though finds po lidocine gives better relief.   Ice chips only.  Not hungry.   IVFs timed out.    OBJECTIVE:         Vital signs in last 24 hours:    Temp:  [97.7 F (36.5 C)-98.6 F (37 C)] 98.5 F (36.9 C) (06/21 0516) Pulse Rate:  [56-61] 58 (06/21 0516) Resp:  [18-19] 18 (06/21 0516) BP: (114-124)/(59-67) 119/67 (06/21 0516) SpO2:  [94 %-99 %] 99 % (06/21 0516) Last BM Date: 12/04/16 Filed Weights   12/04/16 1920 12/04/16 2208  Weight: 95.3 kg (210 lb) 95.3 kg (210 lb)   General: pleasant,  Looks well Heart: RRR Chest: clear bil.  No labored breathing or cough.  No chest wall tenderness Abdomen: soft, NT,ND.  BS active  Extremities: no CCE Neuro/Psych:  Alert, oriented x 3.  No deficits.  Calm, cooperative.   Intake/Output from previous day: 06/20 0701 - 06/21 0700 In: 1341.3 [I.V.:1341.3] Out: -   Intake/Output this shift: No intake/output data recorded.  Lab Results:  Recent Labs  12/05/16 0005 12/05/16 0015 12/05/16 0647  WBC 13.1*  --  9.6  HGB 14.4 13.9 13.4  HCT 44.1 41.0 42.1  PLT 249  --  252   BMET  Recent Labs  12/05/16 0005 12/05/16 0015 12/05/16 0647  NA 136 140 140  K 3.9 4.0 3.8  CL 107 105 109  CO2 24  --  27  GLUCOSE 119* 116* 101*  BUN 13 15 11   CREATININE 1.09 1.00 1.17  CALCIUM 8.4*  --  8.3*   LFT No results for input(s): PROT, ALBUMIN, AST, ALT, ALKPHOS, BILITOT, BILIDIR, IBILI in the last 72 hours. PT/INR No results for input(s): LABPROT, INR in the last 72 hours. Hepatitis Panel No results for input(s): HEPBSAG, HCVAB, HEPAIGM, HEPBIGM in the last 72 hours.  Studies/Results: Ct Chest W Contrast  Result  Date: 12/05/2016 CLINICAL DATA:  Felt a piece of steak get stuck in esophagus. Vomiting and shortness of breath, acute onset. Initial encounter. EXAM: CT CHEST, ABDOMEN, AND PELVIS WITH CONTRAST TECHNIQUE: Multidetector CT imaging of the chest, abdomen and pelvis was performed following the standard protocol during bolus administration of intravenous contrast. CONTRAST:  129mL ISOVUE-300 IOPAMIDOL (ISOVUE-300) INJECTION 61% COMPARISON:  None. FINDINGS: CT CHEST FINDINGS Cardiovascular: The heart is normal in size. The thoracic aorta is grossly unremarkable. The great vessels are within normal limits. No calcific atherosclerotic disease is seen. Mediastinum/Nodes: There is mild wall thickening along the mid to distal esophagus, raising concern for esophagitis or acute inflammation due to vomiting. No mediastinal lymphadenopathy is seen. No pericardial effusion is identified. The visualized portions of the thyroid gland are grossly unremarkable. Lungs/Pleura: Mild bibasilar atelectasis is noted. No pleural effusion or pneumothorax is seen. No masses are identified. Musculoskeletal: No acute osseous abnormalities are identified. The visualized musculature is unremarkable in appearance. CT ABDOMEN PELVIS FINDINGS Hepatobiliary: The liver is unremarkable in appearance. The gallbladder is unremarkable in appearance. The common bile duct remains normal in caliber. Pancreas: The pancreas is within normal limits. Spleen: The spleen is unremarkable in appearance. Adrenals/Urinary Tract: The adrenal glands are unremarkable in appearance.  The kidneys are within normal limits. There is no evidence of hydronephrosis. No renal or ureteral stones are identified. Mild nonspecific perinephric stranding is noted bilaterally. Stomach/Bowel: The stomach is unremarkable in appearance. The small bowel is within normal limits. The appendix is normal in caliber, without evidence of appendicitis. The colon is unremarkable in appearance.  Vascular/Lymphatic: The abdominal aorta is unremarkable in appearance. The inferior vena cava is grossly unremarkable. No retroperitoneal lymphadenopathy is seen. No pelvic sidewall lymphadenopathy is identified. Reproductive: The bladder is mildly distended and grossly unremarkable. The prostate is enlarged, measuring 5.8 cm in transverse dimension. The patient is status post vasectomy. Other: No additional soft tissue abnormalities are seen. Musculoskeletal: No acute osseous abnormalities are identified. Multilevel vacuum phenomenon is noted along the lumbar spine. The visualized musculature is unremarkable in appearance. IMPRESSION: 1. Mild wall thickening along the mid to distal esophagus, raising concern for esophagitis or possibly acute inflammation secondary to recent vomiting. No foreign objects seen within the esophagus. 2. Mild bibasilar atelectasis noted.  Lungs otherwise clear. 3. Enlarged prostate noted. Electronically Signed   By: Garald Balding M.D.   On: 12/05/2016 01:22   Ct Abdomen Pelvis W Contrast  Result Date: 12/05/2016 CLINICAL DATA:  Felt a piece of steak get stuck in esophagus. Vomiting and shortness of breath, acute onset. Initial encounter. EXAM: CT CHEST, ABDOMEN, AND PELVIS WITH CONTRAST TECHNIQUE: Multidetector CT imaging of the chest, abdomen and pelvis was performed following the standard protocol during bolus administration of intravenous contrast. CONTRAST:  153mL ISOVUE-300 IOPAMIDOL (ISOVUE-300) INJECTION 61% COMPARISON:  None. FINDINGS: CT CHEST FINDINGS Cardiovascular: The heart is normal in size. The thoracic aorta is grossly unremarkable. The great vessels are within normal limits. No calcific atherosclerotic disease is seen. Mediastinum/Nodes: There is mild wall thickening along the mid to distal esophagus, raising concern for esophagitis or acute inflammation due to vomiting. No mediastinal lymphadenopathy is seen. No pericardial effusion is identified. The visualized  portions of the thyroid gland are grossly unremarkable. Lungs/Pleura: Mild bibasilar atelectasis is noted. No pleural effusion or pneumothorax is seen. No masses are identified. Musculoskeletal: No acute osseous abnormalities are identified. The visualized musculature is unremarkable in appearance. CT ABDOMEN PELVIS FINDINGS Hepatobiliary: The liver is unremarkable in appearance. The gallbladder is unremarkable in appearance. The common bile duct remains normal in caliber. Pancreas: The pancreas is within normal limits. Spleen: The spleen is unremarkable in appearance. Adrenals/Urinary Tract: The adrenal glands are unremarkable in appearance. The kidneys are within normal limits. There is no evidence of hydronephrosis. No renal or ureteral stones are identified. Mild nonspecific perinephric stranding is noted bilaterally. Stomach/Bowel: The stomach is unremarkable in appearance. The small bowel is within normal limits. The appendix is normal in caliber, without evidence of appendicitis. The colon is unremarkable in appearance. Vascular/Lymphatic: The abdominal aorta is unremarkable in appearance. The inferior vena cava is grossly unremarkable. No retroperitoneal lymphadenopathy is seen. No pelvic sidewall lymphadenopathy is identified. Reproductive: The bladder is mildly distended and grossly unremarkable. The prostate is enlarged, measuring 5.8 cm in transverse dimension. The patient is status post vasectomy. Other: No additional soft tissue abnormalities are seen. Musculoskeletal: No acute osseous abnormalities are identified. Multilevel vacuum phenomenon is noted along the lumbar spine. The visualized musculature is unremarkable in appearance. IMPRESSION: 1. Mild wall thickening along the mid to distal esophagus, raising concern for esophagitis or possibly acute inflammation secondary to recent vomiting. No foreign objects seen within the esophagus. 2. Mild bibasilar atelectasis noted.  Lungs otherwise clear. 3.  Enlarged prostate noted. Electronically Signed   By: Garald Balding M.D.   On: 12/05/2016 01:22   Scheduled Meds: . fluticasone  1 spray Each Nare Daily  . gi cocktail  30 mL Oral TID  . lidocaine  15 mL Mouth/Throat TID  . pantoprazole (PROTONIX) IV  40 mg Intravenous Q12H  . sucralfate  1 g Oral Q6H   Continuous Infusions: PRN Meds:.acetaminophen **OR** acetaminophen, albuterol, HYDROmorphone (DILAUDID) injection, ondansetron **OR** ondansetron (ZOFRAN) IV   ASSESMENT:   *  Distal esophageal laceration but no perforation in association with food impaction (steak) and retching.  EGD 12/04/16: cratered, linear ulcer with stigmata of bleeding in distal esophagus.  No food in esophagus, but present in stomach so likely food passed prior to EGD.   IV Protonix, Carafate, viscous lidocaine in place.   *  Leukocytosis.  Resolved. No abx on board  PLAN   *   Restart IVF with 10 KCL as I suspect K will become depleted otherwise.  No changes to plan or meds otherwise.     Marcus Watson  12/06/2016, 9:17 AM Pager: [phone]     Attending physician's note   I have taken an interval history, reviewed the chart and examined the patient. I agree with the Advanced Practitioner's note, impression and recommendations. Improving. Clear liquids as tolerated. Trial of Tramadol PO q6h prn instead of IV pain medication prn. Pt advised to use GI cocktail and Carafate suspension before meals to reduce odynophagia, chest pain. If pt tolerates clears would advance to full liquids later today or tomorrow. Anticipate discharge home Friday or Saturday.   Lucio Edward, MD [facility] [phone] Mon-Fri 8a-5p [phone] after 5p, weekends, holidays

## 2016-12-06 NOTE — Progress Notes (Signed)
Patient's diet was advanced to clear liquids. Pt consumed 1 carton of apple juice and chicken broth. Tolerated well, although he states it was slightly painful while swallowing. No choking or nausea noted. Pt currently ambulating around unit.

## 2016-12-07 LAB — GLUCOSE, CAPILLARY
Glucose-Capillary: 116 mg/dL — ABNORMAL HIGH (ref 65–99)
Glucose-Capillary: 100 mg/dL — ABNORMAL HIGH (ref 65–99)
Glucose-Capillary: 86 mg/dL (ref 65–99)

## 2016-12-07 LAB — BASIC METABOLIC PANEL
Anion gap: 7 (ref 5–15)
BUN: 7 mg/dL (ref 6–20)
CO2: 23 mmol/L (ref 22–32)
Calcium: 8.2 mg/dL — ABNORMAL LOW (ref 8.9–10.3)
Chloride: 108 mmol/L (ref 101–111)
Creatinine, Ser: 0.92 mg/dL (ref 0.61–1.24)
GFR calc Af Amer: >60 mL/min (ref >60)
GFR calc non Af Amer: >60 mL/min (ref >60)
Glucose, Bld: 90 mg/dL (ref 65–99)
Potassium: 4.2 mmol/L (ref 3.5–5.1)
Sodium: 138 mmol/L (ref 135–145)

## 2016-12-07 MED ORDER — TRAMADOL HCL 50 MG PO TABS: 100.0000 mg | ORAL_TABLET | Freq: Four times a day (QID) | ORAL | 0 refills | Status: DC | PRN

## 2016-12-07 MED ORDER — GI COCKTAIL ~~LOC~~: 30.0000 mL | Freq: Three times a day (TID) | ORAL | 1 refills | Status: DC

## 2016-12-07 MED ORDER — SUCRALFATE 1 GM/10ML PO SUSP: 1.0000 g | Freq: Three times a day (TID) | ORAL | 0 refills | Status: DC

## 2016-12-07 MED ORDER — PANTOPRAZOLE SODIUM 40 MG PO TBEC: 40.0000 mg | DELAYED_RELEASE_TABLET | Freq: Every day | ORAL | 1 refills | Status: DC

## 2016-12-07 NOTE — Discharge Summary (Signed)
Physician Discharge Summary  Dannel Rafter ZOX:[id number] DOB: 11-25-1960 DOA: 12/04/2016  PCP: Patient, No Pcp Per  Admit date: 12/04/2016 Discharge date: 12/07/2016  Recommendations for Outpatient Follow-up:  1. Pt will need to follow up with Dr. Carlean Purl 6/29 at 10:15 am   Discharge Diagnoses:  Principal Problem:   Food impaction of esophagus Active Problems:   Esophageal laceration   Other chest pain   Odynophagia  Discharge Condition: Stable  Diet recommendation: Full liquid diet   History of present illness:  56 y.o.malewith history of nasal allergies and asthmaand erectile dysfunction had food stuck in his esophagus, had EGD by Dr. Carlean Purl and the steak piecewas pushed into the stomach, following which patient had worsening pain and there was concern for laceration.  Assessment & Plan: Esophageal food impaction - complicated by deep distal esophageal laceration about 5 mm in length in the setting of vomiting and retching  - no evidence of perforation on CT chest - advanced died to full liquids and pt tolerated well - GI cleared for discharge  - continue GI cocktail, tramadol, Protonix QD   - pt to follow up with Dr Carlean Purl    DVT prophylaxis: SCD's Code Status: Full  Family Communication: pt at bedside  Disposition Plan: home   Consultants:   GI  Procedures:   None  Antimicrobials:   None  Procedures/Studies: Ct Chest W Contrast  Result Date: 12/05/2016 CLINICAL DATA:  Felt a piece of steak get stuck in esophagus. Vomiting and shortness of breath, acute onset. Initial encounter. EXAM: CT CHEST, ABDOMEN, AND PELVIS WITH CONTRAST TECHNIQUE: Multidetector CT imaging of the chest, abdomen and pelvis was performed following the standard protocol during bolus administration of intravenous contrast. CONTRAST:  183mL ISOVUE-300 IOPAMIDOL (ISOVUE-300) INJECTION 61% COMPARISON:  None. FINDINGS: CT CHEST FINDINGS Cardiovascular: The heart is normal in  size. The thoracic aorta is grossly unremarkable. The great vessels are within normal limits. No calcific atherosclerotic disease is seen. Mediastinum/Nodes: There is mild wall thickening along the mid to distal esophagus, raising concern for esophagitis or acute inflammation due to vomiting. No mediastinal lymphadenopathy is seen. No pericardial effusion is identified. The visualized portions of the thyroid gland are grossly unremarkable. Lungs/Pleura: Mild bibasilar atelectasis is noted. No pleural effusion or pneumothorax is seen. No masses are identified. Musculoskeletal: No acute osseous abnormalities are identified. The visualized musculature is unremarkable in appearance. CT ABDOMEN PELVIS FINDINGS Hepatobiliary: The liver is unremarkable in appearance. The gallbladder is unremarkable in appearance. The common bile duct remains normal in caliber. Pancreas: The pancreas is within normal limits. Spleen: The spleen is unremarkable in appearance. Adrenals/Urinary Tract: The adrenal glands are unremarkable in appearance. The kidneys are within normal limits. There is no evidence of hydronephrosis. No renal or ureteral stones are identified. Mild nonspecific perinephric stranding is noted bilaterally. Stomach/Bowel: The stomach is unremarkable in appearance. The small bowel is within normal limits. The appendix is normal in caliber, without evidence of appendicitis. The colon is unremarkable in appearance. Vascular/Lymphatic: The abdominal aorta is unremarkable in appearance. The inferior vena cava is grossly unremarkable. No retroperitoneal lymphadenopathy is seen. No pelvic sidewall lymphadenopathy is identified. Reproductive: The bladder is mildly distended and grossly unremarkable. The prostate is enlarged, measuring 5.8 cm in transverse dimension. The patient is status post vasectomy. Other: No additional soft tissue abnormalities are seen. Musculoskeletal: No acute osseous abnormalities are identified.  Multilevel vacuum phenomenon is noted along the lumbar spine. The visualized musculature is unremarkable in appearance. IMPRESSION: 1. Mild wall  thickening along the mid to distal esophagus, raising concern for esophagitis or possibly acute inflammation secondary to recent vomiting. No foreign objects seen within the esophagus. 2. Mild bibasilar atelectasis noted.  Lungs otherwise clear. 3. Enlarged prostate noted. Electronically Signed   By: Garald Balding M.D.   On: 12/05/2016 01:22   Ct Abdomen Pelvis W Contrast  Result Date: 12/05/2016 CLINICAL DATA:  Felt a piece of steak get stuck in esophagus. Vomiting and shortness of breath, acute onset. Initial encounter. EXAM: CT CHEST, ABDOMEN, AND PELVIS WITH CONTRAST TECHNIQUE: Multidetector CT imaging of the chest, abdomen and pelvis was performed following the standard protocol during bolus administration of intravenous contrast. CONTRAST:  166mL ISOVUE-300 IOPAMIDOL (ISOVUE-300) INJECTION 61% COMPARISON:  None. FINDINGS: CT CHEST FINDINGS Cardiovascular: The heart is normal in size. The thoracic aorta is grossly unremarkable. The great vessels are within normal limits. No calcific atherosclerotic disease is seen. Mediastinum/Nodes: There is mild wall thickening along the mid to distal esophagus, raising concern for esophagitis or acute inflammation due to vomiting. No mediastinal lymphadenopathy is seen. No pericardial effusion is identified. The visualized portions of the thyroid gland are grossly unremarkable. Lungs/Pleura: Mild bibasilar atelectasis is noted. No pleural effusion or pneumothorax is seen. No masses are identified. Musculoskeletal: No acute osseous abnormalities are identified. The visualized musculature is unremarkable in appearance. CT ABDOMEN PELVIS FINDINGS Hepatobiliary: The liver is unremarkable in appearance. The gallbladder is unremarkable in appearance. The common bile duct remains normal in caliber. Pancreas: The pancreas is within  normal limits. Spleen: The spleen is unremarkable in appearance. Adrenals/Urinary Tract: The adrenal glands are unremarkable in appearance. The kidneys are within normal limits. There is no evidence of hydronephrosis. No renal or ureteral stones are identified. Mild nonspecific perinephric stranding is noted bilaterally. Stomach/Bowel: The stomach is unremarkable in appearance. The small bowel is within normal limits. The appendix is normal in caliber, without evidence of appendicitis. The colon is unremarkable in appearance. Vascular/Lymphatic: The abdominal aorta is unremarkable in appearance. The inferior vena cava is grossly unremarkable. No retroperitoneal lymphadenopathy is seen. No pelvic sidewall lymphadenopathy is identified. Reproductive: The bladder is mildly distended and grossly unremarkable. The prostate is enlarged, measuring 5.8 cm in transverse dimension. The patient is status post vasectomy. Other: No additional soft tissue abnormalities are seen. Musculoskeletal: No acute osseous abnormalities are identified. Multilevel vacuum phenomenon is noted along the lumbar spine. The visualized musculature is unremarkable in appearance. IMPRESSION: 1. Mild wall thickening along the mid to distal esophagus, raising concern for esophagitis or possibly acute inflammation secondary to recent vomiting. No foreign objects seen within the esophagus. 2. Mild bibasilar atelectasis noted.  Lungs otherwise clear. 3. Enlarged prostate noted. Electronically Signed   By: Garald Balding M.D.   On: 12/05/2016 01:22     Discharge Exam: Vitals:   12/06/16 2135 12/07/16 0629  BP: 127/73 125/69  Pulse: 60 (!) 52  Resp: 18 16  Temp: 98.7 F (37.1 C) 98 F (36.7 C)   Vitals:   12/06/16 0516 12/06/16 1627 12/06/16 2135 12/07/16 0629  BP: 119/67 132/78 127/73 125/69  Pulse: (!) 58 (!) 53 60 (!) 52  Resp: 18  18 16   Temp: 98.5 F (36.9 C) 98.2 F (36.8 C) 98.7 F (37.1 C) 98 F (36.7 C)  TempSrc: Oral  Oral  Oral  SpO2: 99% 97% 100% 99%  Weight:      Height:        General: Pt is alert, follows commands appropriately, not in acute distress  Cardiovascular: Regular rate and rhythm, S1/S2 +, no murmurs, no rubs, no gallops Respiratory: Clear to auscultation bilaterally, no wheezing, no crackles, no rhonchi Abdominal: Soft, non tender, non distended, bowel sounds +, no guarding Extremities: no edema, no cyanosis, pulses palpable bilaterally DP and PT Neuro: Grossly nonfocal  Discharge Instructions  Discharge Instructions    Increase activity slowly    Complete by:  As directed      Allergies as of 12/07/2016      Reactions   Penicillins Anaphylaxis   Has patient had a PCN reaction causing immediate rash, facial/tongue/throat swelling, SOB or lightheadedness with hypotension: No Has patient had a PCN reaction causing severe rash involving mucus membranes or skin necrosis: No Has patient had a PCN reaction that required hospitalization: No Has patient had a PCN reaction occurring within the last 10 years: No If all of the above answers are "NO", then may proceed with Cephalosporin use.      Medication List    TAKE these medications   albuterol 108 (90 Base) MCG/ACT inhaler Commonly known as:  PROVENTIL HFA;VENTOLIN HFA Inhale 1 puff into the lungs as needed for wheezing.   budesonide 32 MCG/ACT nasal spray Commonly known as:  RHINOCORT AQUA Place 1-2 sprays into the nose See admin instructions. Two sprays the morning and one and night   cetirizine 10 MG tablet Commonly known as:  ZYRTEC Take 10 mg by mouth at bedtime.   [employer] ALLERGY EYE DROPS OP Apply 2 drops to eye as needed. Two drops in both eyes   DEPO-TESTOSTERONE 200 MG/ML injection Generic drug:  testosterone cypionate Inject 200 mg into the muscle See admin instructions. Every two weeks   gi cocktail Susp suspension Take 30 mLs by mouth 3 (three) times daily. Each 30 ml dose contains Maalox 12 mL, Viscous Lidocaine 12  mL, Donnatal 6 mL   glucosamine-chondroitin 500-400 MG tablet Take 1 tablet by mouth daily.   pantoprazole 40 MG tablet Commonly known as:  PROTONIX Take 1 tablet (40 mg total) by mouth daily.   sildenafil 20 MG tablet Commonly known as:  REVATIO Take 20 mg by mouth daily.   sucralfate 1 GM/10ML suspension Commonly known as:  CARAFATE Take 10 mLs (1 g total) by mouth 4 (four) times daily -  with meals and at bedtime.   traMADol 50 MG tablet Commonly known as:  ULTRAM Take 2 tablets (100 mg total) by mouth every 6 (six) hours as needed for moderate pain or severe pain.   VITAMIN D-1000 MAX ST 1000 units tablet Generic drug:  Cholecalciferol Take 1,000 Units by mouth daily.      Follow-up Information    [name], MD Follow up.   Specialty:  Gastroenterology Contact information: [address] [place] [address] [phone]        Theodis Blaze, MD Follow up.   Specialty:  Internal Medicine Why:  Please call me with any questions. [phone] Contact information: [address] [place] [address] [phone]            The results of significant diagnostics from this hospitalization (including imaging, microbiology, ancillary and laboratory) are listed below for reference.     Microbiology: No results found for this or any previous visit (from the past 240 hour(s)).   Labs: Basic Metabolic Panel:  Recent Labs Lab 12/05/16 0005 12/05/16 0015 12/05/16 0647 12/06/16 1136 12/07/16 0655  NA 136 140 140 140 138  K 3.9 4.0 3.8  3.7 4.2  CL 107 105 109 107 108  CO2 24  --  27 27 23   GLUCOSE 119* 116* 101* 113* 90  BUN 13 15 11 7 7   CREATININE 1.09 1.00 1.17 0.97 0.92  CALCIUM 8.4*  --  8.3* 8.4* 8.2*   CBC:  Recent Labs Lab 12/05/16 0005 12/05/16 0015 12/05/16 0647 12/06/16 0904  WBC 13.1*  --  9.6 7.3  HGB 14.4 13.9 13.4 13.3  HCT 44.1 41.0 42.1 41.3  MCV 92.3  --  93.6 94.5  PLT 249  --  252 216    CBG:  Recent Labs Lab 12/05/16 2355 12/06/16 0737 12/06/16 1818 12/07/16 0020 12/07/16 0906  GLUCAP 105* 86 131* 100* 116*   SIGNED: Time coordinating discharge:  30 minutes  Faye Ramsay, MD  [facility] 12/07/2016, 12:25 PM Pager [phone]  If 7PM-7AM, please contact night-coverage [email or web address] Password [email or web address]

## 2016-12-07 NOTE — Discharge Instructions (Signed)
Please continue on full liquid diet for at least 48 hours and you can slowly start to advance diet at home as tolerated.    Full Liquid Diet A full liquid diet may be used:  To help you transition from a clear liquid diet to a soft diet.  When your body is healing and can only tolerate foods that are easy to digest.  Before or after certain a procedure, test, or surgery (such as stomach or intestinal surgeries).  If you have trouble swallowing or chewing.  A full liquid diet includes fluids and foods that are liquid or will become liquid at room temperature. The full liquid diet gives you the proteins, fluids, salts, and minerals that you need for energy. If you continue this diet for more than 72 hours, talk to your health care provider about how many calories you need to consume. If you continue the diet for more than 5 days, talk to your health care provider about taking a multivitamin or a nutritional supplement. What do I need to know about a full liquid diet?  You may have any liquid.  You may have any food that becomes a liquid at room temperature. The food is considered a liquid if it can be poured off a spoon at room temperature.  Drink one serving of citrus or vitamin C-enriched fruit juice daily. What foods can I eat? Grains Any grain food that can be pureed in soup (such as crackers, pasta, and rice). Hot cereal (such as farina or oatmeal) that has been blended. Talk to your health care provider or dietitian about these foods. Vegetables Pulp-free tomato or vegetable juice. Vegetables pureed in soup. Fruits Fruit juice, including nectars and juices with pulp. Meats and Other Protein Sources Eggs in custard, eggnog mix, and eggs used in ice cream or pudding. Strained meats, like in baby food, may be allowed. Consult your health care provider. Dairy Milk and milk-based beverages, including milk shakes and instant breakfast mixes. Smooth yogurt. Pureed cottage cheese. Avoid  these foods if they are not well tolerated. Beverages All beverages, including liquid nutritional supplements. Ask your health care provider if you can have carbonated beverages. They may not be well tolerated. Condiments Iodized salt, pepper, spices, and flavorings. Cocoa powder. Vinegar, ketchup, yellow mustard, smooth sauces (such as hollandaise, cheese sauce, or white sauce), and soy sauce. Sweets and Desserts Custard, smooth pudding. Flavored gelatin. Tapioca, junket. Plain ice cream, sherbet, fruit ices. Frozen ice pops, frozen fudge pops, pudding pops, and other frozen bars with cream. Syrups, including chocolate syrup. Sugar, honey, jelly. Fats and Oils Margarine, butter, cream, sour cream, and oils. Other Broth and cream soups. Strained, broth-based soups. The items listed above may not be a complete list of recommended foods or beverages. Contact your dietitian for more options. What foods can I not eat? Grains All breads. Grains are not allowed unless they are pureed into soup. Vegetables Vegetables are not allowed unless they are juiced, or cooked and pureed into soup. Fruits Fruits are not allowed unless they are juiced. Meats and Other Protein Sources Any meat or fish. Cooked or raw eggs. Nut butters. Dairy Cheese. Condiments Stone ground mustards. Fats and Oils Fats that are coarse or chunky. Sweets and Desserts Ice cream or other frozen desserts that have any solids in them or on top, such as nuts, chocolate chips, and pieces of cookies. Cakes. Cookies. Candy. Others Soups with chunks or pieces in them. The items listed above may not be a complete  list of foods and beverages to avoid. Contact your dietitian for more information. This information is not intended to replace advice given to you by your health care provider. Make sure you discuss any questions you have with your health care provider. Document Released: 06/04/2005 Document Revised: 11/10/2015 Document  Reviewed: 04/09/2013 Elsevier Interactive Patient Education  2017 [employer].

## 2016-12-07 NOTE — Progress Notes (Signed)
Patient being d/c home with prescriptions. Verbalize understanding. IV cath removed and intact. No c/o pain at this time or at site. Patient ambulated in hallway and room as ordered. Awaiting transport via outside.

## 2016-12-07 NOTE — Progress Notes (Signed)
Patient ID: Marcus Watson, male   DOB: Nov 09, 1960, 56 y.o.   MRN: [id number]    Progress Note   Subjective  Sitting in chair eating clear liquid breakfast - "getting better slowly but surely" Tolerating clear liquids without difficulty- has not used dilaudid since last night- took Tramadol this am- first time- says GI cocktail helps more than anything    Objective   Vital signs in last 24 hours: Temp:  [98 F (36.7 C)-98.7 F (37.1 C)] 98 F (36.7 C) (06/22 0629) Pulse Rate:  [52-60] 52 (06/22 0629) Resp:  [16-18] 16 (06/22 0629) BP: (125-132)/(69-78) 125/69 (06/22 0629) SpO2:  [97 %-100 %] 99 % (06/22 0629) Last BM Date: 12/04/16   General:    white male  in NAD Heart:  Regular rate and rhythm; no murmurs Lungs: Respirations even and unlabored, lungs CTA bilaterally Abdomen:  Soft, nontender and nondistended. Normal bowel sounds.   Intake/Output from previous day: 06/21 0701 - 06/22 0700 In: 2732.5 [P.O.:120; I.V.:2612.5] Out: -  Intake/Output this shift: No intake/output data recorded.  Lab Results:  Recent Labs  12/05/16 0005 12/05/16 0015 12/05/16 0647 12/06/16 0904  WBC 13.1*  --  9.6 7.3  HGB 14.4 13.9 13.4 13.3  HCT 44.1 41.0 42.1 41.3  PLT 249  --  252 216   BMET  Recent Labs  12/05/16 0647 12/06/16 1136 12/07/16 0655  NA 140 140 138  K 3.8 3.7 4.2  CL 109 107 108  CO2 27 27 23   GLUCOSE 101* 113* 90  BUN 11 7 7   CREATININE 1.17 0.97 0.92  CALCIUM 8.3* 8.4* 8.2*      Assessment / Plan:    #1 56 yo WM with acute esophageal laceration/ulcer secondary to food impaction  No definite stricture on EGD  Pt is stable, no fever, WBC remains normal, negative CT's  Plan: Advance to full liquids - discharge home on full liquids, then gradually advance to soft over next few days at home Discharge GI meds:  GI cocktail 30 cc  30 min AC, and hs, and prn for pain, 10 day supply Tramadol 50 mg q 6 hrs prn, for a few days Protonix 40 mg po  qam  Pt can be discharged home later today, or in am if he requires IV pain meds today Will arrange follow up with Dr Carlean Purl in 10-14 days   Principal Problem:   Food impaction of esophagus Active Problems:   Esophageal laceration   Other chest pain   Odynophagia    LOS: 2 days   Amy Esterwood  12/07/2016, 9:02 AM     Attending physician's note   I have taken an interval history, reviewed the chart and examined the patient. I agree with the Advanced Practitioner's note, impression and recommendations. Odynophagia and chest pain improving steadily. Full liquids today and if he can tolerate this diet he can be discharged, perhaps later today or tomorrow. GI medication and GI follow up as above. GI signing off.   Lucio Edward, MD [facility] [phone] Mon-Fri 8a-5p [phone] after 5p, weekends, holidays

## 2016-12-07 NOTE — Progress Notes (Signed)
Patient ID: Marcus Watson, male   DOB: 1961/06/17, 56 y.o.   MRN: [id number]   Pt has appt with Dr Carlean Purl - GI 6/29 at 10:15am

## 2016-12-08 ENCOUNTER — Telehealth: Payer: Self-pay | Admitting: Gastroenterology

## 2016-12-08 DIAGNOSIS — R1310 Dysphagia, unspecified: Secondary | ICD-10-CM

## 2016-12-08 MED ORDER — LIDOCAINE VISCOUS 2 % MT SOLN: 15.0000 mL | Freq: Four times a day (QID) | OROMUCOSAL | 0 refills | Status: AC | PRN

## 2016-12-08 NOTE — Telephone Encounter (Signed)
On call note. Pt can't obtain GI cocktail says enough donnatal not available at a few pharmacies they tried. DC GI cocktail. Start viscous lidocaine 2% tid ac.

## 2016-12-11 DIAGNOSIS — F411 Generalized anxiety disorder: Secondary | ICD-10-CM | POA: Diagnosis not present

## 2016-12-14 ENCOUNTER — Encounter: Payer: Self-pay | Admitting: Internal Medicine

## 2016-12-14 ENCOUNTER — Ambulatory Visit (INDEPENDENT_AMBULATORY_CARE_PROVIDER_SITE_OTHER): Payer: BLUE CROSS/BLUE SHIELD | Admitting: Internal Medicine

## 2016-12-14 VITALS — BP 120/64 | HR 64 | Ht 71.75 in | Wt 209.0 lb

## 2016-12-14 DIAGNOSIS — K228 Other specified diseases of esophagus: Secondary | ICD-10-CM | POA: Diagnosis not present

## 2016-12-14 DIAGNOSIS — K219 Gastro-esophageal reflux disease without esophagitis: Secondary | ICD-10-CM | POA: Diagnosis not present

## 2016-12-14 DIAGNOSIS — K222 Esophageal obstruction: Secondary | ICD-10-CM

## 2016-12-14 DIAGNOSIS — K2289 Other specified disease of esophagus: Secondary | ICD-10-CM

## 2016-12-14 DIAGNOSIS — T18128A Food in esophagus causing other injury, initial encounter: Secondary | ICD-10-CM | POA: Diagnosis not present

## 2016-12-14 NOTE — Progress Notes (Signed)
Marcus Watson 56 y.o. 04/25/1961 [id number]  Assessment & Plan:   Encounter Diagnoses  Name Primary?  . Esophageal obstruction due to food impaction Yes  . Esophageal laceration   . Gastroesophageal reflux disease, esophagitis presence not specified    Stay on PPI Stop carafate when done 7/2 advance diet EGD August - possible dilation - ? GERD w/ stx vs EoE  The risks and benefits as well as alternatives of endoscopic procedure(s) have been discussed and reviewed. All questions answered. The patient agrees to proceed.     Subjective:   Chief Complaint:Follow-up after esophageal laceration and food impaction  HPI The patient is here, I met him in the emergency department on June 19. He had a food impaction and esophageal laceration associated with that. He was hospitalized for a few days due to severe odynophagia. That is improving. He is still on a soft diet. He is on Carafate and pantoprazole at this point. Prior to this steak food impaction he had been having increasing heartburn though I don't think he was having dysphagia. There is a background of seasonal allergies as well. He is asking if he can return to physical activity and advance his diet. He is feeling well overall and not requiring anything to treat painful swallowing i.e. like lidocaine or EI cocktail. He has a trip to [place] for business coming up soon. Allergies  Allergen Reactions  . Penicillins Anaphylaxis    Has patient had a PCN reaction causing immediate rash, facial/tongue/throat swelling, SOB or lightheadedness with hypotension: No Has patient had a PCN reaction causing severe rash involving mucus membranes or skin necrosis: No Has patient had a PCN reaction that required hospitalization: No Has patient had a PCN reaction occurring within the last 10 years: No If all of the above answers are "NO", then may proceed with Cephalosporin use.   Current Meds  Medication Sig  . albuterol (PROVENTIL  HFA;VENTOLIN HFA) 108 (90 Base) MCG/ACT inhaler Inhale 1 puff into the lungs as needed for wheezing.   . budesonide (RHINOCORT AQUA) 32 MCG/ACT nasal spray Place 1-2 sprays into the nose See admin instructions. Two sprays the morning and one and night  . cetirizine (ZYRTEC) 10 MG tablet Take 10 mg by mouth at bedtime.   . Cholecalciferol (VITAMIN D-1000 MAX ST) 1000 units tablet Take 1,000 Units by mouth daily.  [name] glucosamine-chondroitin 500-400 MG tablet Take 1 tablet by mouth daily.  [name] Ketotifen Fumarate ([facility] ALLERGY EYE DROPS OP) Apply 2 drops to eye as needed. Two drops in both eyes  . pantoprazole (PROTONIX) 40 MG tablet Take 1 tablet (40 mg total) by mouth daily.  . sildenafil (REVATIO) 20 MG tablet Take 20 mg by mouth daily.  . sucralfate (CARAFATE) 1 GM/10ML suspension Take 10 mLs (1 g total) by mouth 4 (four) times daily -  with meals and at bedtime.  [name] testosterone cypionate (DEPO-TESTOSTERONE) 200 MG/ML injection Inject 200 mg into the muscle See admin instructions. Every two weeks   Past Medical History:  Diagnosis Date  . Erectile dysfunction   . Esophageal laceration 12/04/2016  . Food impaction of esophagus 12/04/2016  . Mild asthma   . Seasonal allergies   . Testosterone deficiency    Past Surgical History:  Procedure Laterality Date  . [place]ONOSCOPY  2015   in [place]  . ESOPHAGOGASTRODUODENOSCOPY N/A 12/04/2016   Procedure: ESOPHAGOGASTRODUODENOSCOPY (EGD);  Surgeon: [name], MD;  Location: [facility] ENDOSCOPY;  Service: Endoscopy;  Laterality: N/A;  . HERNIA REPAIR    .  KNEE ARTHROCENTESIS    . TONSILLECTOMY     Social History   Social History  . Marital status: Married    Spouse name: N/A  . Number of children: N/A  . Years of education: N/A   Occupational History  . Not on file.   Social History Main Topics  . Smoking status: Never Smoker  . Smokeless tobacco: Former Systems developer  . Alcohol use Yes     Comment: occassional  . Drug use: No  . Sexual activity: Not on  file   Other Topics Concern  . Not on file   Social History Narrative   Married - from [place] area - moved to [place] approx 2015 to work for [employer]   family history includes Hypertension in his other.   Review of Systems As per history of present illness  Objective:   Physical Exam BP 120/64 (BP Location: Left Arm, Patient Position: Sitting, Cuff Size: Normal)   Pulse 64   Ht 5' 11.75" (1.822 m) Comment: height measured without shoes  Wt 209 lb (94.8 kg)   BMI 28.54 kg/m  NAD   15 minutes time spent with patient > half in counseling coordination of care

## 2016-12-14 NOTE — Patient Instructions (Addendum)
    When carafate runs out - stop. Physical activity is no problem.  Gradually return to normal diet - would start that change on 7/2.  Stay on pantoprazole.  You have been scheduled for an endoscopy. Please follow written instructions given to you at your visit today. If you use inhalers (even only as needed), please bring them with you on the day of your procedure. Your physician has requested that you go to www.startemmi.com and enter the access code given to you at your visit today. This web site gives a general overview about your procedure. However, you should still follow specific instructions given to you by our office regarding your preparation for the procedure.   Normal BMI (Body Mass Index- based on height and weight) is between 19 and 25. Your BMI today is Body mass index is 28.54 kg/m. [name] Please consider follow up  regarding your BMI with your Primary Care Provider.     I appreciate the opportunity to care for you. [name], MD, [facility]

## 2016-12-18 DIAGNOSIS — F411 Generalized anxiety disorder: Secondary | ICD-10-CM | POA: Diagnosis not present

## 2017-01-08 ENCOUNTER — Encounter: Payer: Self-pay | Admitting: Internal Medicine

## 2017-01-15 DIAGNOSIS — F411 Generalized anxiety disorder: Secondary | ICD-10-CM | POA: Diagnosis not present

## 2017-01-16 ENCOUNTER — Ambulatory Visit (AMBULATORY_SURGERY_CENTER): Payer: BLUE CROSS/BLUE SHIELD | Admitting: Internal Medicine

## 2017-01-16 ENCOUNTER — Encounter: Payer: Self-pay | Admitting: Internal Medicine

## 2017-01-16 VITALS — BP 117/76 | HR 53 | Temp 98.4°F | Resp 17 | Ht 71.75 in | Wt 209.0 lb

## 2017-01-16 DIAGNOSIS — K209 Esophagitis, unspecified without bleeding: Secondary | ICD-10-CM

## 2017-01-16 DIAGNOSIS — T18128A Food in esophagus causing other injury, initial encounter: Secondary | ICD-10-CM | POA: Diagnosis not present

## 2017-01-16 DIAGNOSIS — K222 Esophageal obstruction: Secondary | ICD-10-CM | POA: Diagnosis not present

## 2017-01-16 DIAGNOSIS — R1310 Dysphagia, unspecified: Secondary | ICD-10-CM | POA: Diagnosis not present

## 2017-01-16 DIAGNOSIS — K208 Other esophagitis: Secondary | ICD-10-CM | POA: Diagnosis not present

## 2017-01-16 DIAGNOSIS — K200 Eosinophilic esophagitis: Secondary | ICD-10-CM | POA: Diagnosis not present

## 2017-01-16 DIAGNOSIS — R1319 Other dysphagia: Secondary | ICD-10-CM

## 2017-01-16 MED ORDER — SODIUM CHLORIDE 0.9 % IV SOLN: 500.0000 mL | INTRAVENOUS | Status: DC

## 2017-01-16 NOTE — Progress Notes (Signed)
A/ox3 pleased with MAC, report to [facility]

## 2017-01-16 NOTE — Patient Instructions (Addendum)
There was inflammation where the esophagus and stomach meet. I saw changes in the esophagus that suggest a condition called eosinophilic esophagitis.  In eosinophilic esophagitis (e-o-sin-o-FILL-ik uh-sof-uh-JIE-tis), a type of white blood cell (eosinophil) builds up in the lining of the tube that connects your mouth to your stomach (esophagus). This buildup, which is a reaction to foods, allergens or acid reflux, can inflame or injure the esophageal tissue. Damaged esophageal tissue can lead to difficulty swallowing or cause food to get caught when you swallow.  Eosinophilic esophagitis is a chronic immune system disease. It has been identified only in the past two decades, but is now considered a major cause of digestive system (gastrointestinal) illness. Research is ongoing and will likely lead to revisions in its diagnosis and treatment.   I dilated the esophagus also.  Will notify you about the biopsy results and plans.  I appreciate the opportunity to care for you. [name], MD, FACG     YOU HAD AN ENDOSCOPIC PROCEDURE TODAY AT [facility]:   Refer to the procedure report that was given to you for any specific questions about what was found during the examination.  If the procedure report does not answer your questions, please call your gastroenterologist to clarify.  If you requested that your care partner not be given the details of your procedure findings, then the procedure report has been included in a sealed envelope for you to review at your convenience later.  YOU SHOULD EXPECT: Some feelings of bloating in the abdomen. Passage of more gas than usual.  Walking can help get rid of the air that was put into your GI tract during the procedure and reduce the bloating. If you had a lower endoscopy (such as a colonoscopy or flexible sigmoidoscopy) you may notice spotting of blood in your stool or on the toilet paper. If you underwent a bowel prep for your  procedure, you may not have a normal bowel movement for a few days.  Please Note:  You might notice some irritation and congestion in your nose or some drainage.  This is from the oxygen used during your procedure.  There is no need for concern and it should clear up in a day or so.  SYMPTOMS TO REPORT IMMEDIATELY:    Following upper endoscopy (EGD)  Vomiting of blood or coffee ground material  New chest pain or pain under the shoulder blades  Painful or persistently difficult swallowing  New shortness of breath  Fever of 100F or higher  Black, tarry-looking stools  For urgent or emergent issues, a gastroenterologist can be reached at any hour by calling [phone].   DIET:  Please follow the dilatation diet the rest of today.  Handout was given to your care partner.  Drink plenty of fluids but you should avoid alcoholic beverages for 24 hours.  ACTIVITY:  You should plan to take it easy for the rest of today and you should NOT DRIVE or use heavy machinery until tomorrow (because of the sedation medicines used during the test).    FOLLOW UP: Our staff will call the number listed on your records the next business day following your procedure to check on you and address any questions or concerns that you may have regarding the information given to you following your procedure. If we do not reach you, we will leave a message.  However, if you are feeling well and you are not experiencing any problems, there is no need  to return our call.  We will assume that you have returned to your regular daily activities without incident.  If any biopsies were taken you will be contacted by phone or by letter within the next 1-3 weeks.  Please call us at [phone] if you have not heard about the biopsies in 3 weeks.    SIGNATURES/CONFIDENTIALITY: You and/or your care partner have signed paperwork which will be entered into your electronic medical record.  These signatures attest to the fact  that that the information above on your After Visit Summary has been reviewed and is understood.  Full responsibility of the confidentiality of this discharge information lies with you and/or your care-partner.    Handouts were given to your care partner on esophagitis, GERD and esophageal dilatation diet to follow the rest of today. You may resume your current medications today. Await biopsy results. Please call if any questions or concerns.

## 2017-01-16 NOTE — Op Note (Signed)
[place] [facility] Patient Name: Marcus Watson Procedure Date: 01/16/2017 10:08 AM MRN: [id number] Endoscopist: [name] , MD Age: 56 Referring MD:  Date of Birth: Feb 17, 1961 Gender: Male Account #: [id number] Procedure:                Upper GI endoscopy Indications:              Dysphagia Medicines:                Propofol per Anesthesia, Monitored Anesthesia Care Procedure:                Pre-Anesthesia Assessment:                           - Prior to the procedure, a History and Physical                            was performed, and patient medications and                            allergies were reviewed. The patient's tolerance of                            previous anesthesia was also reviewed. The risks                            and benefits of the procedure and the sedation                            options and risks were discussed with the patient.                            All questions were answered, and informed consent                            was obtained. Prior Anticoagulants: The patient has                            taken no previous anticoagulant or antiplatelet                            agents. ASA Grade Assessment: II - A patient with                            mild systemic disease. After reviewing the risks                            and benefits, the patient was deemed in                            satisfactory condition to undergo the procedure.                           After obtaining informed consent, the endoscope was  passed under direct vision. Throughout the                            procedure, the patient's blood pressure, pulse, and                            oxygen saturations were monitored continuously. The                            Endoscope was introduced through the mouth, and                            advanced to the second part of duodenum. The upper                            GI endoscopy was  accomplished without difficulty.                            The patient tolerated the procedure well. Scope In: Scope Out: Findings:                 LA Grade B (one or more mucosal breaks greater than                            5 mm, not extending between the tops of two mucosal                            folds) esophagitis with no bleeding was found in                            the distal esophagus. Biopsies were taken with a                            cold forceps for histology. Verification of patient                            identification for the specimen was done. Estimated                            blood loss was minimal.                           Mucosal changes including longitudinal furrows,                            white plaques and longitudinal markings were found                            in the entire esophagus. Esophageal findings were                            graded using the Eosinophilic Esophagitis  Endoscopic Reference Score (EoE-EREFS) as: Edema                            Grade 1 Present (decreased clarity or absence of                            vascular markings), Rings Grade 0 None (no ridges                            or rings seen), Exudates Grade 2 Severe (scattered                            white lesions involving 10 percent or greater of                            the esophageal surface area), Furrows Grade 1                            Present (vertical lines with or without visible                            depth) and Stricture none (no stricture found).                            Biopsies were obtained from the proximal and distal                            esophagus with cold forceps for histology of                            suspected eosinophilic esophagitis. Verification of                            patient identification for the specimen was done.                            Estimated blood loss was minimal.                            The scope was withdrawn. Dilation was performed in                            the entire esophagus with a Maloney dilator with                            mild resistance at 54 Fr. Estimated blood loss was                            minimal.                           The cardia and gastric fundus were normal on  retroflexion.                           The exam was otherwise without abnormality. Complications:            No immediate complications. Estimated Blood Loss:     Estimated blood loss was minimal. Impression:               - LA Grade B reflux esophagitis. Biopsied.                           - Esophageal mucosal changes suggestive of                            eosinophilic esophagitis. Biopsied.                           - The examination was otherwise normal.                           - Dilation performed in the entire esophagus. Recommendation:           - Patient has a contact number available for                            emergencies. The signs and symptoms of potential                            delayed complications were discussed with the                            patient. Return to normal activities tomorrow.                            Written discharge instructions were provided to the                            patient.                           - Clear liquids x 1 hour then soft foods rest of                            day. Start prior diet tomorrow.                           - Continue present medications.                           - Await pathology results. [name], MD 01/16/2017 10:32:45 AM This report has been signed electronically.

## 2017-01-16 NOTE — Progress Notes (Signed)
No problems noted in the recovery room. maw 

## 2017-01-16 NOTE — Progress Notes (Signed)
Called to room to assist during endoscopic procedure.  Patient ID and intended procedure confirmed with present staff. Received instructions for my participation in the procedure from the performing physician.  

## 2017-01-17 ENCOUNTER — Telehealth: Payer: Self-pay | Admitting: *Deleted

## 2017-01-17 NOTE — Telephone Encounter (Signed)
  Follow up Call-  Call back number 01/16/2017  Post procedure Call Back phone  # [phone]  Permission to leave phone message Yes     Patient questions:  Do you have a fever, pain , or abdominal swelling? No. Pain Score  0 *  Have you tolerated food without any problems? Yes.    Have you been able to return to your normal activities? Yes.    Do you have any questions about your discharge instructions: Diet   No. Medications  No. Follow up visit  No.  Do you have questions or concerns about your Care? No.  Actions: * If pain score is 4 or above: No action needed, pain <4.

## 2017-01-22 ENCOUNTER — Encounter: Payer: Self-pay | Admitting: Internal Medicine

## 2017-01-22 DIAGNOSIS — K200 Eosinophilic esophagitis: Secondary | ICD-10-CM

## 2017-01-22 DIAGNOSIS — F411 Generalized anxiety disorder: Secondary | ICD-10-CM | POA: Diagnosis not present

## 2017-01-22 HISTORY — DX: Eosinophilic esophagitis: K20.0

## 2017-01-22 NOTE — Progress Notes (Signed)
Patient aware of results and recommendations. See patient advice request.

## 2017-01-22 NOTE — Progress Notes (Signed)
Call from office - please let him know biopsies show some reflux and some eosinophilic esophagitis. Needs REV me next available.  IF he is not better re: dysphagia at this time let me know.  LEC - no letter/no recall

## 2017-01-29 DIAGNOSIS — F411 Generalized anxiety disorder: Secondary | ICD-10-CM | POA: Diagnosis not present

## 2017-02-06 DIAGNOSIS — F411 Generalized anxiety disorder: Secondary | ICD-10-CM | POA: Diagnosis not present

## 2017-02-08 DIAGNOSIS — D225 Melanocytic nevi of trunk: Secondary | ICD-10-CM | POA: Diagnosis not present

## 2017-02-08 DIAGNOSIS — L700 Acne vulgaris: Secondary | ICD-10-CM | POA: Diagnosis not present

## 2017-02-08 DIAGNOSIS — L905 Scar conditions and fibrosis of skin: Secondary | ICD-10-CM | POA: Diagnosis not present

## 2017-02-08 DIAGNOSIS — D2261 Melanocytic nevi of right upper limb, including shoulder: Secondary | ICD-10-CM | POA: Diagnosis not present

## 2017-02-19 DIAGNOSIS — F411 Generalized anxiety disorder: Secondary | ICD-10-CM | POA: Diagnosis not present

## 2017-02-26 DIAGNOSIS — J069 Acute upper respiratory infection, unspecified: Secondary | ICD-10-CM | POA: Diagnosis not present

## 2017-02-26 DIAGNOSIS — F411 Generalized anxiety disorder: Secondary | ICD-10-CM | POA: Diagnosis not present

## 2017-03-04 ENCOUNTER — Ambulatory Visit (INDEPENDENT_AMBULATORY_CARE_PROVIDER_SITE_OTHER): Payer: BLUE CROSS/BLUE SHIELD

## 2017-03-04 ENCOUNTER — Ambulatory Visit (INDEPENDENT_AMBULATORY_CARE_PROVIDER_SITE_OTHER): Payer: BLUE CROSS/BLUE SHIELD | Admitting: Orthopaedic Surgery

## 2017-03-04 DIAGNOSIS — M7061 Trochanteric bursitis, right hip: Secondary | ICD-10-CM

## 2017-03-04 DIAGNOSIS — M25561 Pain in right knee: Secondary | ICD-10-CM | POA: Diagnosis not present

## 2017-03-04 DIAGNOSIS — M25551 Pain in right hip: Secondary | ICD-10-CM

## 2017-03-04 MED ORDER — LIDOCAINE HCL 1 % IJ SOLN
3.0000 mL | INTRAMUSCULAR | Status: AC | PRN
  Administered 2017-03-04: 3 mL

## 2017-03-04 MED ORDER — METHYLPREDNISOLONE ACETATE 40 MG/ML IJ SUSP
40.0000 mg | INTRAMUSCULAR | Status: AC | PRN
  Administered 2017-03-04: 40 mg via INTRA_ARTICULAR

## 2017-03-04 NOTE — Progress Notes (Signed)
Office Visit Note   Patient: Marcus Watson           Date of Birth: 1960/08/14           MRN: [id number] Visit Date: 03/04/2017              Requested by: No referring provider defined for this encounter. PCP: Vernie Shanks, MD   Assessment & Plan: Visit Diagnoses:  1. Pain in right hip   2. Acute pain of right knee   3. Trochanteric bursitis, right hip     Plan: We went over his x-rays in detail and talked about treatment options and the plan. Injections were discussed as well as the risk and benefits injections and he did wish proceed with a right hip trochanteric injection in her right knee steroid injection as well and the knee joint. I gave him a handout on hyaluronic acid. He'll gradually get back into his exercising of this next week. I'll see him back in 2 weeks to see how is doing overall. All questions were encouraged and answered.  Follow-Up Instructions: Return in about 2 weeks (around 03/18/2017).   Orders:  Orders Placed This Encounter  Procedures  . Large Joint Injection/Arthrocentesis  . Large Joint Injection/Arthrocentesis  . XR HIP UNILAT W OR W/O PELVIS 1V RIGHT  . XR Knee 1-2 Views Right   No orders of the defined types were placed in this encounter.     Procedures: Large Joint Inj Date/Time: 03/04/2017 9:53 AM Performed by: Mcarthur Rossetti Authorized by: Jean Rosenthal Y   Location:  Knee Site:  R knee Ultrasound Guidance: No   Fluoroscopic Guidance: No   Arthrogram: No   Medications:  3 mL lidocaine 1 %; 40 mg methylPREDNISolone acetate 40 MG/ML Large Joint Inj Date/Time: 03/04/2017 9:53 AM Performed by: Mcarthur Rossetti Authorized by: Mcarthur Rossetti   Location:  Hip Site:  R greater trochanter Ultrasound Guidance: No   Fluoroscopic Guidance: No   Arthrogram: No   Medications:  3 mL lidocaine 1 %; 40 mg methylPREDNISolone acetate 40 MG/ML     Clinical Data: No additional  findings.   Subjective: No chief complaint on file. Patient is a very pleasant 56 year old active individual who has had acute right knee pain for a few weeks now. He is also had pain on the lateral aspect of his right hip. He says he had a right knee arthroscopy in 2000 for meniscal tear with locking of his knee. He says his knees been fine since then he gets no locking catching but 2 weeks ago after being on the elliptical with exercising he developed severe knee swelling that is since subsided. He is held off completely from exercising and working out since then. He points the trochanteric areas source of pain on his right hip and the right knee does not hurt with pivoting activities it just had swelling that is since subsided.  HPI  Review of Systems He currently denies any headache, chest pain, fever, chills, nausea, vomiting. He denies any groin pain.  Objective: Vital Signs: There were no vitals taken for this visit.  Physical Exam He is alert and oriented 3 and in no acute distress Ortho Exam Examination of his right hip shows fluid range of motion with internal rotation rotation with the pain only over the trochanteric area of the hip. Examination of the right knee shows negative Murray's and negative Lachman's exam. His only very mild effusion. There is slight varus mild alignment  but excellent full range of motion. Specialty Comments:  No specialty comments available.  Imaging: Xr Knee 1-2 Views Right  Result Date: 03/04/2017 An AP and lateral of the right knee show no acute findings other than slight medial joint space narrowing.    PMFS History: Patient Active Problem List   Diagnosis Date Noted  . Pain in right hip 03/04/2017  . Acute pain of right knee 03/04/2017  . Trochanteric bursitis, right hip 03/04/2017  . Eosinophilic esophagitis 06/06/7587   Past Medical History:  Diagnosis Date  . Eosinophilic esophagitis 08/17/5496  . Erectile dysfunction   .  Esophageal laceration 12/04/2016  . Food impaction of esophagus 12/04/2016  . Melanoma ([place])    back  . Mild asthma   . Seasonal allergies   . Testosterone deficiency     Family History  Problem Relation Age of Onset  . Hypertension Other   . Colon cancer Neg Hx   . Esophageal cancer Neg Hx   . Rectal cancer Neg Hx   . Stomach cancer Neg Hx     Past Surgical History:  Procedure Laterality Date  . [place]ONOSCOPY  2015   in [place]  . ESOPHAGOGASTRODUODENOSCOPY N/A 12/04/2016   Procedure: ESOPHAGOGASTRODUODENOSCOPY (EGD);  Surgeon: [name], MD;  Location: [facility] ENDOSCOPY;  Service: Endoscopy;  Laterality: N/A;  . HERNIA REPAIR    . KNEE ARTHROCENTESIS    . NASAL SINUS SURGERY    . TONSILLECTOMY     Social History   Occupational History  . Not on file.   Social History Main Topics  . Smoking status: Never Smoker  . Smokeless tobacco: Former Systems developer    Types: Snuff  . Alcohol use Yes     Comment: occassional  . Drug use: No  . Sexual activity: Not on file

## 2017-03-18 ENCOUNTER — Ambulatory Visit (INDEPENDENT_AMBULATORY_CARE_PROVIDER_SITE_OTHER): Payer: BLUE CROSS/BLUE SHIELD | Admitting: Internal Medicine

## 2017-03-18 ENCOUNTER — Encounter: Payer: Self-pay | Admitting: Internal Medicine

## 2017-03-18 VITALS — BP 110/72 | HR 68 | Ht 73.0 in | Wt 214.4 lb

## 2017-03-18 DIAGNOSIS — K219 Gastro-esophageal reflux disease without esophagitis: Secondary | ICD-10-CM

## 2017-03-18 DIAGNOSIS — K200 Eosinophilic esophagitis: Secondary | ICD-10-CM

## 2017-03-18 DIAGNOSIS — K222 Esophageal obstruction: Secondary | ICD-10-CM | POA: Diagnosis not present

## 2017-03-18 MED ORDER — AMBULATORY NON FORMULARY MEDICATION: 1 refills | Status: DC

## 2017-03-18 MED ORDER — PANTOPRAZOLE SODIUM 40 MG PO TBEC: 40.0000 mg | DELAYED_RELEASE_TABLET | Freq: Every day | ORAL | 3 refills | Status: DC

## 2017-03-18 NOTE — Patient Instructions (Addendum)
    In eosinophilic esophagitis (e-o-sin-o-FILL-ik uh-sof-uh-JIE-tis), a type of white blood cell (eosinophil) builds up in the lining of the tube that connects your mouth to your stomach (esophagus). This buildup, which is a reaction to foods, allergens or acid reflux, can inflame or injure the esophageal tissue. Damaged esophageal tissue can lead to difficulty swallowing or cause food to get caught when you swallow.  Eosinophilic esophagitis is a chronic immune system disease. It has been identified only in the past two decades, but is now considered a major cause of digestive system (gastrointestinal) illness. Research is ongoing and will likely lead to revisions in its diagnosis and treatment.  [facility] is going to call you about your Budesonide rx.  Please MyChart [place] in one month with an update so we can go forward with a game plan.   We have sent the following prescriptions to your mail in pharmacy:  Pantoprazole  If you have not heard from your mail in pharmacy within 1 week or if you have not received your medication in the mail, please contact us at [phone] so we may find out why.   I appreciate the opportunity to care for you. Silvano Rusk, MD, [facility]

## 2017-03-18 NOTE — Progress Notes (Signed)
Arien Benincasa 56 y.o. 09/08/1960 [id number]  Assessment & Plan:   Eosinophilic esophagitis Stopped PPI was not refilled or authorized? - having sxs of dysphagia  Explained needs to stay on PPI Will Tx busdesonide 2 mg bid oral x 1 month and reassess via My chart He is going to [place] and other [place] country in 1 week and ok to hold budesonide then if he cannot transport it  F/U TBA - use My Chart If still w/ dysphagia after 1-2 mos budesonide and PPI then redilate   GERD with stricture Stay on PPI See EoE A/P  I appreciate the opportunity to care for this patient. CC: Vernie Shanks, MD   Subjective:   Chief Complaint: Follow-up eosinophilic esophagitis and dysphagia  HPI The patient is here reporting some intermittent solid food dysphagia to dry foods again. I had found a lacerated esophagus after food impaction this summer, and then he was brought back after a 3 day hospitalization for the pain with that, and underwent EGD with biopsy showing eosinophilic esophagitis, distal erosive esophagitis seen and 54 French Maloney dilator performed. He did well after that but apparently the pharmacy said that we did not authorize a refill of his PPI, I'll have a record of any communication there, and he stayed off his PPI in the last month or so. He is having recurrent symptoms as above.  Allergies  Allergen Reactions  . Penicillins Anaphylaxis    Has patient had a PCN reaction causing immediate rash, facial/tongue/throat swelling, SOB or lightheadedness with hypotension: No Has patient had a PCN reaction causing severe rash involving mucus membranes or skin necrosis: No Has patient had a PCN reaction that required hospitalization: No Has patient had a PCN reaction occurring within the last 10 years: No If all of the above answers are "NO", then may proceed with Cephalosporin use.   Current Meds  Medication Sig  . budesonide (RHINOCORT AQUA) 32 MCG/ACT nasal spray Place  1-2 sprays into the nose See admin instructions. Two sprays the morning and one and night  . cetirizine (ZYRTEC) 10 MG tablet Take 10 mg by mouth at bedtime.   . Cholecalciferol (VITAMIN D-1000 MAX ST) 1000 units tablet Take 1,000 Units by mouth daily.  [name] DYMISTA 137-50 MCG/ACT SUSP   . glucosamine-chondroitin 500-400 MG tablet Take 1 tablet by mouth daily.  [name] Ketotifen Fumarate ([facility] ALLERGY EYE DROPS OP) Apply 2 drops to eye as needed. Two drops in both eyes  . pantoprazole (PROTONIX) 40 MG tablet Take 1 tablet (40 mg total) by mouth daily.  . sildenafil (REVATIO) 20 MG tablet Take 20 mg by mouth daily.  [name] testosterone cypionate (DEPO-TESTOSTERONE) 200 MG/ML injection Inject 200 mg into the muscle See admin instructions. Every two weeks  . [DISCONTINUED] pantoprazole (PROTONIX) 40 MG tablet Take 1 tablet (40 mg total) by mouth daily.  . [DISCONTINUED] pantoprazole (PROTONIX) 40 MG tablet Take 1 tablet (40 mg total) by mouth daily.   Past Medical History:  Diagnosis Date  . Eosinophilic esophagitis 4/0/0867  . Erectile dysfunction   . Esophageal laceration 12/04/2016  . Food impaction of esophagus 12/04/2016  . GERD with stricture   . Melanoma ([place])    back  . Mild asthma   . Seasonal allergies   . Testosterone deficiency    Past Surgical History:  Procedure Laterality Date  . [place]  2015   [place]  . ESOPHAGOGASTRODUODENOSCOPY N/A 12/04/2016   Procedure: ESOPHAGOGASTRODUODENOSCOPY (EGD);  Surgeon: [name], MD;  Location: [facility] ENDOSCOPY;  Service: Endoscopy;  Laterality: N/A;  . HERNIA REPAIR    . KNEE ARTHROCENTESIS    . NASAL SINUS SURGERY    . TONSILLECTOMY       Review of Systems As above recent knee injections  Objective:   Physical Exam BP 110/72   Pulse 68   Ht 6\' 1"  (1.854 m)   Wt 214 lb 6 oz (97.2 kg)   BMI 28.28 kg/m  No acute distress  15 minutes time spent with patient > half in counseling coordination of care

## 2017-03-19 ENCOUNTER — Encounter: Payer: Self-pay | Admitting: Internal Medicine

## 2017-03-19 ENCOUNTER — Ambulatory Visit (INDEPENDENT_AMBULATORY_CARE_PROVIDER_SITE_OTHER): Payer: BLUE CROSS/BLUE SHIELD | Admitting: Physician Assistant

## 2017-03-19 DIAGNOSIS — M25551 Pain in right hip: Secondary | ICD-10-CM | POA: Diagnosis not present

## 2017-03-19 DIAGNOSIS — K222 Esophageal obstruction: Secondary | ICD-10-CM

## 2017-03-19 DIAGNOSIS — M25561 Pain in right knee: Secondary | ICD-10-CM | POA: Diagnosis not present

## 2017-03-19 DIAGNOSIS — K219 Gastro-esophageal reflux disease without esophagitis: Secondary | ICD-10-CM | POA: Insufficient documentation

## 2017-03-19 NOTE — Progress Notes (Signed)
Office Visit Note   Patient: Marcus Watson           Date of Birth: 08-Sep-1960           MRN: [id number] Visit Date: 03/19/2017              Requested by: Marcus Shanks, MD [place], [place] [address] PCP: Marcus Shanks, MD   Assessment & Plan: Visit Diagnoses:  1. Acute pain of right knee   2. Pain in right hip     Plan:  Encouraged him to continue to work on quad strengthening and IT band stretching. If in fact the knee injection is short-lived in controlling his knee pain may consider supplemental injection. He'll follow up on an as-needed basis.  Follow-Up Instructions: Return if symptoms worsen or fail to improve.   Orders:  No orders of the defined types were placed in this encounter.  No orders of the defined types were placed in this encounter.     Procedures: No procedures performed   Clinical Data: No additional findings.   Subjective: No chief complaint on file.   HPI Marcus Watson turns today follow-up of his right hip trochanteric injection right knee injection. He states both injections really work well. He is having no pain in the knee. No mechanical symptoms of the knee. No swelling knee. Got back to his regular activities. In regards to his right hip he's having no pain in the hip. Denies any numbness tingling down the right leg. Review of Systems No catching locking giving way painful popping right knee. No numbness tingling down either leg. No hip or groin pain.  Objective: Vital Signs: There were no vitals taken for this visit.  Physical Exam  Constitutional: He is oriented to person, place, and time. He appears well-developed and well-nourished. No distress.  Pulmonary/Chest: Effort normal.  Neurological: He is alert and oriented to person, place, and time.  Skin: He is not diaphoretic.  Psychiatric: He has a normal mood and affect.    Ortho Exam Right knee full range of motion without pain. No effusion abnormal  warmth erythema. No instability valgus varus stressing. Nontender over the medial joint line. Right hip good range of motion without pain. Nontender over the trochanteric region. Ambulates without any assistive devices and a nonantalgic gait. All Specialty Comments:  No specialty comments available.  Imaging: No results found.   PMFS History: Patient Active Problem List   Diagnosis Date Noted  . Pain in right hip 03/04/2017  . Acute pain of right knee 03/04/2017  . Trochanteric bursitis, right hip 03/04/2017  . Eosinophilic esophagitis 54/27/0623   Past Medical History:  Diagnosis Date  . Eosinophilic esophagitis 12/21/2829  . Erectile dysfunction   . Esophageal laceration 12/04/2016  . Food impaction of esophagus 12/04/2016  . Melanoma ([place])    back  . Mild asthma   . Seasonal allergies   . Testosterone deficiency     Family History  Problem Relation Age of Onset  . Hypertension Other   . Colon cancer Neg Hx   . Esophageal cancer Neg Hx   . Rectal cancer Neg Hx   . Stomach cancer Neg Hx     Past Surgical History:  Procedure Laterality Date  . [place]  2015   [place]  . ESOPHAGOGASTRODUODENOSCOPY N/A 12/04/2016   Procedure: ESOPHAGOGASTRODUODENOSCOPY (EGD);  Surgeon: [name], MD;  Location: [facility] ENDOSCOPY;  Service: Endoscopy;  Laterality: N/A;  . HERNIA REPAIR    .  KNEE ARTHROCENTESIS    . NASAL SINUS SURGERY    . TONSILLECTOMY     Social History   Occupational History  . Not on file.   Social History Main Topics  . Smoking status: Never Smoker  . Smokeless tobacco: Former Systems developer    Types: Snuff  . Alcohol use Yes     Comment: occassional  . Drug use: No  . Sexual activity: Not on file

## 2017-03-19 NOTE — Assessment & Plan Note (Signed)
Stay on PPI See EoE A/P 

## 2017-03-19 NOTE — Assessment & Plan Note (Signed)
Stopped PPI was not refilled or authorized? - having sxs of dysphagia  Explained needs to stay on PPI Will Tx busdesonide 2 mg bid oral x 1 month and reassess via My chart He is going to [place] and other [place] country in 1 week and ok to hold budesonide then if he cannot transport it  F/U TBA - use My Chart If still w/ dysphagia after 1-2 mos budesonide and PPI then redilate

## 2017-03-20 DIAGNOSIS — F411 Generalized anxiety disorder: Secondary | ICD-10-CM | POA: Diagnosis not present

## 2017-04-01 DIAGNOSIS — F411 Generalized anxiety disorder: Secondary | ICD-10-CM | POA: Diagnosis not present

## 2017-04-09 DIAGNOSIS — F411 Generalized anxiety disorder: Secondary | ICD-10-CM | POA: Diagnosis not present

## 2017-04-17 ENCOUNTER — Encounter: Payer: Self-pay | Admitting: Internal Medicine

## 2017-04-18 ENCOUNTER — Other Ambulatory Visit: Payer: Self-pay | Admitting: Internal Medicine

## 2017-04-18 DIAGNOSIS — F411 Generalized anxiety disorder: Secondary | ICD-10-CM | POA: Diagnosis not present

## 2017-04-18 MED ORDER — PANTOPRAZOLE SODIUM 40 MG PO TBEC: 40.0000 mg | DELAYED_RELEASE_TABLET | Freq: Two times a day (BID) | ORAL | 3 refills | Status: DC

## 2017-04-23 ENCOUNTER — Telehealth: Payer: Self-pay

## 2017-04-23 NOTE — Telephone Encounter (Signed)
-----   Message from [name], MD sent at 04/23/2017  9:38 AM EST ----- Regarding: needs EGD/dili Please arrange for an EGD dilation in [place] - this month - ok to use a 730 slot but try to avoid a Thurs  Dx eosinophilic esophagitis, esophageal stricture

## 2017-04-23 NOTE — Telephone Encounter (Signed)
Left message to call me back and we'll set up the EGD appointment and pre-visit.

## 2017-04-23 NOTE — Telephone Encounter (Signed)
Patient called back and scheduled for direct EGD for 05/29/17 and pre-visit on 05/14/17.

## 2017-05-03 DIAGNOSIS — F411 Generalized anxiety disorder: Secondary | ICD-10-CM | POA: Diagnosis not present

## 2017-05-07 DIAGNOSIS — F411 Generalized anxiety disorder: Secondary | ICD-10-CM | POA: Diagnosis not present

## 2017-05-14 ENCOUNTER — Ambulatory Visit (AMBULATORY_SURGERY_CENTER): Payer: Self-pay

## 2017-05-14 ENCOUNTER — Other Ambulatory Visit: Payer: Self-pay

## 2017-05-14 VITALS — Ht 74.0 in | Wt 220.2 lb

## 2017-05-14 DIAGNOSIS — R1310 Dysphagia, unspecified: Secondary | ICD-10-CM

## 2017-05-14 NOTE — Progress Notes (Signed)
Denies allergies to eggs or soy products. Denies complication of anesthesia or sedation. Denies use of weight loss medication. Denies use of O2.   Emmi instructions declined.  

## 2017-05-15 DIAGNOSIS — F411 Generalized anxiety disorder: Secondary | ICD-10-CM | POA: Diagnosis not present

## 2017-05-23 ENCOUNTER — Encounter: Payer: Self-pay | Admitting: Internal Medicine

## 2017-05-23 DIAGNOSIS — R7989 Other specified abnormal findings of blood chemistry: Secondary | ICD-10-CM | POA: Diagnosis not present

## 2017-05-23 DIAGNOSIS — N400 Enlarged prostate without lower urinary tract symptoms: Secondary | ICD-10-CM | POA: Diagnosis not present

## 2017-05-29 ENCOUNTER — Telehealth: Payer: Self-pay

## 2017-05-29 ENCOUNTER — Other Ambulatory Visit: Payer: Self-pay

## 2017-05-29 ENCOUNTER — Ambulatory Visit (AMBULATORY_SURGERY_CENTER): Payer: BLUE CROSS/BLUE SHIELD | Admitting: Internal Medicine

## 2017-05-29 ENCOUNTER — Encounter: Payer: Self-pay | Admitting: Internal Medicine

## 2017-05-29 VITALS — BP 115/65 | HR 57 | Temp 97.8°F | Resp 14 | Ht 74.0 in | Wt 220.0 lb

## 2017-05-29 DIAGNOSIS — K200 Eosinophilic esophagitis: Secondary | ICD-10-CM | POA: Diagnosis not present

## 2017-05-29 DIAGNOSIS — R1310 Dysphagia, unspecified: Secondary | ICD-10-CM | POA: Diagnosis not present

## 2017-05-29 DIAGNOSIS — R1110 Vomiting, unspecified: Secondary | ICD-10-CM

## 2017-05-29 DIAGNOSIS — K209 Esophagitis, unspecified: Secondary | ICD-10-CM | POA: Diagnosis not present

## 2017-05-29 MED ORDER — SODIUM CHLORIDE 0.9 % IV SOLN: 500.0000 mL | Freq: Once | INTRAVENOUS | Status: DC

## 2017-05-29 NOTE — Telephone Encounter (Signed)
BS scheduled at [facility] 10/17 10:00 patient to be NPO for 4 hours prior Left message for patient to call back

## 2017-05-29 NOTE — Progress Notes (Signed)
A and O x3. Report to RN. Tolerated MAC anesthesia well.Teeth unchanged after procedure.

## 2017-05-29 NOTE — Telephone Encounter (Signed)
-----   Message from [name], MD sent at 05/29/2017 11:07 AM EST ----- Regarding: needs ba swallow and tablet  Please arrange for ba swallow and tablet - probably next week - is leaving town 12/24  Dx dysphagia, regurgitation, ? Zencker's diverticulum  thx

## 2017-05-29 NOTE — Telephone Encounter (Signed)
Patient notified

## 2017-05-29 NOTE — Progress Notes (Signed)
Called to room to assist during endoscopic procedure.  Patient ID and intended procedure confirmed with present staff. Received instructions for my participation in the procedure from the performing physician.  

## 2017-05-29 NOTE — Op Note (Addendum)
[place] Patient Name: Marcus Watson Procedure Date: 05/29/2017 10:13 AM MRN: [id number] Endoscopist: [name] , MD Age: 56 Referring MD:  Date of Birth: [birth year] Gender: Male Account #: [id number] Procedure:                Upper GI endoscopy Indications:              Dysphagia, Eosinophilic esophagitis, Follow-up of                            eosinophilic esophagitis, For therapy of                            eosinophilic esophagitis Medicines:                Propofol per Anesthesia, Monitored Anesthesia Care Procedure:                Pre-Anesthesia Assessment:                           - Prior to the procedure, a History and Physical                            was performed, and patient medications and                            allergies were reviewed. The patient's tolerance of                            previous anesthesia was also reviewed. The risks                            and benefits of the procedure and the sedation                            options and risks were discussed with the patient.                            All questions were answered, and informed consent                            was obtained. Prior Anticoagulants: The patient has                            taken no previous anticoagulant or antiplatelet                            agents. ASA Grade Assessment: II - A patient with                            mild systemic disease. After reviewing the risks                            and benefits, the patient was deemed in  satisfactory condition to undergo the procedure.                           After obtaining informed consent, the endoscope was                            passed under direct vision. Throughout the                            procedure, the patient's blood pressure, pulse, and                            oxygen saturations were monitored continuously. The                            Model  [id number]-[id number]) scope was introduced                            through the mouth, and advanced to the second part                            of duodenum. The upper GI endoscopy was                            accomplished without difficulty. The patient                            tolerated the procedure well. Scope In: Scope Out: Findings:                 Mucosal changes including longitudinal furrows and                            white plaques were found in the upper third of the                            esophagus and in the middle third of the esophagus.                            Esophageal findings were graded using the                            Eosinophilic Esophagitis Endoscopic Reference Score                            (EoE-EREFS) as: Edema Grade 1 Present (decreased                            clarity or absence of vascular markings), Rings                            Grade 0 None (no ridges or rings seen), Exudates  Grade 1 Mild (scattered white lesions involving                            less than 10 percent of the esophageal surface                            area), Furrows Grade 1 Present (vertical lines with                            or without visible depth) and Stricture none (no                            stricture found). Biopsies were obtained from the                            proximal and distal esophagus with cold forceps for                            histology of suspected eosinophilic esophagitis.                            Verification of patient identification for the                            specimen was done. Estimated blood loss was minimal.                           The exam was otherwise without abnormality.                           The cardia and gastric fundus were normal on                            retroflexion.                           The scope was withdrawn. Dilation was performed in                             the entire esophagus with a Maloney dilator with                            mild resistance at 54 Fr. Estimated blood loss:                            none. Complications:            No immediate complications. Estimated Blood Loss:     Estimated blood loss was minimal. Impression:               - Esophageal mucosal changes secondary to                            eosinophilic esophagitis. Biopsied. Much less  inflammation compared to prior - had reflux changes                            then also.                           - The examination was otherwise normal.                           - Dilation performed in the entire esophagus. 8 Fr                            mild resistance no heme Recommendation:           - Patient has a contact number available for                            emergencies. The signs and symptoms of potential                            delayed complications were discussed with the                            patient. Return to normal activities tomorrow.                            Written discharge instructions were provided to the                            patient.                           - Clear liquids x 1 hour then soft foods rest of                            day. Start prior diet tomorrow.                           - Continue present medications.                           - Await pathology results.                           - Is regurgitating pieces of food hours after                            meals. If dilation does not help - will do Ba                            swallow w/ tablet. ? unappreciated Zencker's?,                            could need gastric emptying evaluation also  DISCUSSED IN RECOVERY AND WILL GO AHEAD W/ BA                            SWALLOW [name], MD 05/29/2017 10:31:15 AM This report has been signed electronically.

## 2017-05-29 NOTE — Patient Instructions (Addendum)
Things look a lot better - I did biopsies to reassess also and dilated the esophagus.  Let's see how you do - if you continue to have problems with regurgitating will check a barium swallow test.  We will call results and plans by next week.  I appreciate the opportunity to care for you. [name], MD, FACG YOU HAD AN ENDOSCOPIC PROCEDURE TODAY AT [facility]:   Refer to the procedure report that was given to you for any specific questions about what was found during the examination.  If the procedure report does not answer your questions, please call your gastroenterologist to clarify.  If you requested that your care partner not be given the details of your procedure findings, then the procedure report has been included in a sealed envelope for you to review at your convenience later.  YOU SHOULD EXPECT: Some feelings of bloating in the abdomen. Passage of more gas than usual.  Walking can help get rid of the air that was put into your GI tract during the procedure and reduce the bloating. If you had a lower endoscopy (such as a colonoscopy or flexible sigmoidoscopy) you may notice spotting of blood in your stool or on the toilet paper. If you underwent a bowel prep for your procedure, you may not have a normal bowel movement for a few days.  Please Note:  You might notice some irritation and congestion in your nose or some drainage.  This is from the oxygen used during your procedure.  There is no need for concern and it should clear up in a day or so.  SYMPTOMS TO REPORT IMMEDIATELY:   Following upper endoscopy (EGD)  Vomiting of blood or coffee ground material  New chest pain or pain under the shoulder blades  Painful or persistently difficult swallowing  New shortness of breath  Fever of 100F or higher  Black, tarry-looking stools  For urgent or emergent issues, a gastroenterologist can be reached at any hour by calling [phone].   DIET:  Clear  liquid x 1 hour, then soft foods rest of the day.Start prior diet on tomorrow (Thursday)  Drink plenty of fluids but you should avoid alcoholic beverages for 24 hours.  ACTIVITY:  You should plan to take it easy for the rest of today and you should NOT DRIVE or use heavy machinery until tomorrow (because of the sedation medicines used during the test).    FOLLOW UP: Our staff will call the number listed on your records the next business day following your procedure to check on you and address any questions or concerns that you may have regarding the information given to you following your procedure. If we do not reach you, we will leave a message.  However, if you are feeling well and you are not experiencing any problems, there is no need to return our call.  We will assume that you have returned to your regular daily activities without incident.  If any biopsies were taken you will be contacted by phone or by letter within the next 1-3 weeks.  Please call us at [phone] if you have not heard about the biopsies in 3 weeks.    Await for biopsy results Post Esophageal Dilation Diet given  SIGNATURES/CONFIDENTIALITY: You and/or your care partner have signed paperwork which will be entered into your electronic medical record.  These signatures attest to the fact that that the information above on your After Visit Summary has been  reviewed and is understood.  Full responsibility of the confidentiality of this discharge information lies with you and/or your care-partner. 

## 2017-05-30 ENCOUNTER — Telehealth: Payer: Self-pay | Admitting: *Deleted

## 2017-05-30 NOTE — Telephone Encounter (Signed)
  Follow up Call-  Call back number 05/29/2017 01/16/2017  Post procedure Call Back phone  # [phone]  Permission to leave phone message Yes Yes     Patient questions:  Do you have a fever, pain , or abdominal swelling? No. Pain Score  0 *  Have you tolerated food without any problems? Yes.    Have you been able to return to your normal activities? Yes.    Do you have any questions about your discharge instructions: Diet   No. Medications  No. Follow up visit  No.  Do you have questions or concerns about your Care? No.  Actions: * If pain score is 4 or above: No action needed, pain <4.

## 2017-05-31 DIAGNOSIS — F411 Generalized anxiety disorder: Secondary | ICD-10-CM | POA: Diagnosis not present

## 2017-06-03 ENCOUNTER — Ambulatory Visit (HOSPITAL_COMMUNITY)
Admission: RE | Admit: 2017-06-03 | Discharge: 2017-06-03 | Disposition: A | Discharge status: Home / Self Care | Payer: BLUE CROSS/BLUE SHIELD | Source: Ambulatory Visit | Attending: Internal Medicine | Admitting: Internal Medicine

## 2017-06-03 ENCOUNTER — Encounter (INDEPENDENT_AMBULATORY_CARE_PROVIDER_SITE_OTHER): Payer: Self-pay | Admitting: Orthopaedic Surgery

## 2017-06-03 ENCOUNTER — Ambulatory Visit (INDEPENDENT_AMBULATORY_CARE_PROVIDER_SITE_OTHER): Payer: BLUE CROSS/BLUE SHIELD | Admitting: Physician Assistant

## 2017-06-03 DIAGNOSIS — M25561 Pain in right knee: Secondary | ICD-10-CM | POA: Diagnosis not present

## 2017-06-03 DIAGNOSIS — K219 Gastro-esophageal reflux disease without esophagitis: Secondary | ICD-10-CM | POA: Insufficient documentation

## 2017-06-03 DIAGNOSIS — R1310 Dysphagia, unspecified: Secondary | ICD-10-CM | POA: Diagnosis not present

## 2017-06-03 DIAGNOSIS — R1110 Vomiting, unspecified: Secondary | ICD-10-CM | POA: Diagnosis not present

## 2017-06-03 NOTE — Progress Notes (Signed)
Marcus Watson returns today due to increasing right knee pain.  He is given a right knee injection on 02/22/1817 and done well until last couple of weeks.  Now he is having giving way of the knee.  He states stairs are painful.  Driving is uncomfortable.  He has been taking ibuprofen for the pain.  Been icing the knee due to swelling.  States he tried a knee brace that his son had without any real benefit.  Review of systems: See HPI otherwise negative  Physical exam: General well-developed well-nourished male in no acute distress mood and affect appropriate.  Right knee: Slight effusion no abnormal warmth no erythema he has tenderness along medial lateral joint line.  Positive McMurray's.  Pain with deep flexion of the knee.  Good range of motion in the knee otherwise.  No instability valgus varus stressing.  Impression: Right knee pain with mechanical symptoms  Plan: Due to the fact that the patient continues to have pain in the knee despite conservative treatment that time recommend MRI to evaluate for a meniscal tear of the right knee.  He will follow-up after the MRI to discuss the results and further treatment.  In the interim he could try a pullover knee sleeve with an open patella and continue his ibuprofen.  Questions are encouraged and answered.

## 2017-06-03 NOTE — Progress Notes (Signed)
NL My Chart messge

## 2017-06-04 ENCOUNTER — Other Ambulatory Visit (INDEPENDENT_AMBULATORY_CARE_PROVIDER_SITE_OTHER): Payer: Self-pay

## 2017-06-04 DIAGNOSIS — G8929 Other chronic pain: Secondary | ICD-10-CM

## 2017-06-04 DIAGNOSIS — F411 Generalized anxiety disorder: Secondary | ICD-10-CM | POA: Diagnosis not present

## 2017-06-04 DIAGNOSIS — M25561 Pain in right knee: Principal | ICD-10-CM

## 2017-06-04 NOTE — Progress Notes (Signed)
Let him know biopsies show that the esophagus is healed  I had sent him a message re: NL Ba swallow also  Please ask him how he is doing?  LE[facility] no letter/recall

## 2017-06-05 NOTE — Progress Notes (Signed)
Next step would be a gastric emptying study vs watchful waiting  He can decide what he wants to do and make a f/u appt next available

## 2017-06-20 ENCOUNTER — Ambulatory Visit
Admission: RE | Admit: 2017-06-20 | Discharge: 2017-06-20 | Disposition: A | Discharge status: Home / Self Care | Payer: BLUE CROSS/BLUE SHIELD | Source: Ambulatory Visit | Attending: Surgery | Admitting: Surgery

## 2017-06-20 DIAGNOSIS — G8929 Other chronic pain: Secondary | ICD-10-CM

## 2017-06-20 DIAGNOSIS — M25561 Pain in right knee: Principal | ICD-10-CM

## 2017-06-20 DIAGNOSIS — M23221 Derangement of posterior horn of medial meniscus due to old tear or injury, right knee: Secondary | ICD-10-CM | POA: Diagnosis not present

## 2017-06-20 DIAGNOSIS — F411 Generalized anxiety disorder: Secondary | ICD-10-CM | POA: Diagnosis not present

## 2017-06-24 ENCOUNTER — Encounter (INDEPENDENT_AMBULATORY_CARE_PROVIDER_SITE_OTHER): Payer: Self-pay | Admitting: Physician Assistant

## 2017-06-24 ENCOUNTER — Ambulatory Visit (INDEPENDENT_AMBULATORY_CARE_PROVIDER_SITE_OTHER): Payer: BLUE CROSS/BLUE SHIELD | Admitting: Physician Assistant

## 2017-06-24 DIAGNOSIS — S83203A Other tear of unspecified meniscus, current injury, right knee, initial encounter: Secondary | ICD-10-CM

## 2017-06-24 NOTE — Progress Notes (Signed)
Mr. Cournoyer returns today to go over the MRI results of his right knee.  He states overall his right knee is getting worse and feels stiff.  Hurts to ambulate.  Having giving way sensation of the knee.  Given his knee pain was an acute onset.  Does report that he may be living in [place] in the near future for work purposes.  MRI of the right knee dated 06/20/2017 results are reviewed with the patient using the knee model for visualization.  MRI showed a complex tear the posterior horn of the medial meniscus with a radial component and peripheral meniscal extrusion.  Tricompartmental changes are seen most notably the medial compartment where he has full-thickness cartilage loss of the medial femoral condyle and the medial tibial plateau.  Impression: Right knee medial meniscal tear and tricompartmental arthritic changes  Plan: After discussing the findings with the patient and the fact that his pain was sudden onset he would like to proceed with a right knee arthroscopy in the near future this would include a partial medial meniscectomy.  He understands the risks benefits to include but not limited to DVT/PE prolonged pain, worsening pain, infection.  He understands fully that this may not take care of all of his pain in his knee due to the fact that he does have tricompartmental arthritic changes.  He may benefit from supplemental injections or cortisone injections in the future.  He also may require a right knee replacement if his pain cannot be treated conservatively.  We will see him back 1 week postop.

## 2017-06-26 DIAGNOSIS — F411 Generalized anxiety disorder: Secondary | ICD-10-CM | POA: Diagnosis not present

## 2017-06-27 DIAGNOSIS — G8918 Other acute postprocedural pain: Secondary | ICD-10-CM | POA: Diagnosis not present

## 2017-06-27 DIAGNOSIS — M23221 Derangement of posterior horn of medial meniscus due to old tear or injury, right knee: Secondary | ICD-10-CM | POA: Diagnosis not present

## 2017-06-27 DIAGNOSIS — M2241 Chondromalacia patellae, right knee: Secondary | ICD-10-CM | POA: Diagnosis not present

## 2017-06-27 DIAGNOSIS — M659 Synovitis and tenosynovitis, unspecified: Secondary | ICD-10-CM | POA: Diagnosis not present

## 2017-06-27 DIAGNOSIS — M948X6 Other specified disorders of cartilage, lower leg: Secondary | ICD-10-CM | POA: Diagnosis not present

## 2017-07-04 ENCOUNTER — Ambulatory Visit (INDEPENDENT_AMBULATORY_CARE_PROVIDER_SITE_OTHER): Payer: BLUE CROSS/BLUE SHIELD | Admitting: Physician Assistant

## 2017-07-04 ENCOUNTER — Encounter (INDEPENDENT_AMBULATORY_CARE_PROVIDER_SITE_OTHER): Payer: Self-pay | Admitting: Physician Assistant

## 2017-07-04 DIAGNOSIS — Z9889 Other specified postprocedural states: Secondary | ICD-10-CM

## 2017-07-04 DIAGNOSIS — F411 Generalized anxiety disorder: Secondary | ICD-10-CM | POA: Diagnosis not present

## 2017-07-04 NOTE — Progress Notes (Signed)
[name] returns 1 week status post right knee arthroscopy.  He underwent partial medial meniscectomy.  He did have areas of full-thickness cartilage loss of the medial aspect of the tibial plateau and the medial femoral condyle.  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  States overall that his knee feels better than preop.  He has had no shortness of breath no chest pain fevers chills.  Physical exam: Right knee has good range of motion of the knee.  Port sites are well approximated no signs of infection calf supple nontender.  Impression: 1 week status post right knee arthroscopy with medial meniscectomy  Plan: He will work on range of motion strengthening knee.  We will see him back in 4 weeks check his progress lack of.  He may benefit from supplemental injection in the future.  Did discuss knee friendly exercises with him.

## 2017-07-05 DIAGNOSIS — F411 Generalized anxiety disorder: Secondary | ICD-10-CM | POA: Diagnosis not present

## 2017-07-09 DIAGNOSIS — F411 Generalized anxiety disorder: Secondary | ICD-10-CM | POA: Diagnosis not present

## 2017-07-10 ENCOUNTER — Encounter (INDEPENDENT_AMBULATORY_CARE_PROVIDER_SITE_OTHER): Payer: Self-pay | Admitting: Orthopaedic Surgery

## 2017-07-10 ENCOUNTER — Inpatient Hospital Stay (INDEPENDENT_AMBULATORY_CARE_PROVIDER_SITE_OTHER): Payer: BLUE CROSS/BLUE SHIELD | Admitting: Physician Assistant

## 2017-07-10 ENCOUNTER — Ambulatory Visit (INDEPENDENT_AMBULATORY_CARE_PROVIDER_SITE_OTHER): Payer: BLUE CROSS/BLUE SHIELD | Admitting: Orthopaedic Surgery

## 2017-07-10 DIAGNOSIS — M1711 Unilateral primary osteoarthritis, right knee: Secondary | ICD-10-CM | POA: Diagnosis not present

## 2017-07-10 DIAGNOSIS — Z9889 Other specified postprocedural states: Secondary | ICD-10-CM | POA: Insufficient documentation

## 2017-07-10 MED ORDER — LIDOCAINE HCL 1 % IJ SOLN
3.0000 mL | INTRAMUSCULAR | Status: AC | PRN
  Administered 2017-07-10: 3 mL

## 2017-07-10 MED ORDER — METHYLPREDNISOLONE ACETATE 40 MG/ML IJ SUSP
40.0000 mg | INTRAMUSCULAR | Status: AC | PRN
  Administered 2017-07-10: 40 mg via INTRA_ARTICULAR

## 2017-07-10 NOTE — Progress Notes (Signed)
   Procedure Note  Patient: Marcus Watson             Date of Birth: 1961/05/27           MRN: [id number]             Visit Date: 07/10/2017  Procedures: Visit Diagnoses: Status post arthroscopy of right knee  Unilateral primary osteoarthritis, right knee  Large Joint Inj: R knee on 07/10/2017 2:25 PM Indications: diagnostic evaluation and pain Details: 22 G 1.5 in needle, superolateral approach  Arthrogram: No  Medications: 3 mL lidocaine 1 %; 40 mg methylPREDNISolone acetate 40 MG/ML Outcome: tolerated well, no immediate complications Procedure, treatment alternatives, risks and benefits explained, specific risks discussed. Consent was given by the patient. Immediately prior to procedure a time out was called to verify the correct patient, procedure, equipment, support staff and site/side marked as required. Patient was prepped and draped in the usual sterile fashion.     The patient is following up status post a right knee arthroscopy.  We found a significant medial meniscal tear as well as full-thickness cartilage loss in the medial compartment of his knee.  He had a reaccumulation of fluid today.  On exam he does have moderate effusion.  Is able to drain 50 cc of fluid from the knee and then place a steroid injection in the knee.  We will see him back in 2 weeks because he is appropriate candidate for hyaluronic acid to try to treat some of the arthritic pain long-term.

## 2017-07-11 ENCOUNTER — Inpatient Hospital Stay (INDEPENDENT_AMBULATORY_CARE_PROVIDER_SITE_OTHER): Payer: BLUE CROSS/BLUE SHIELD | Admitting: Physician Assistant

## 2017-07-23 ENCOUNTER — Encounter (INDEPENDENT_AMBULATORY_CARE_PROVIDER_SITE_OTHER): Payer: Self-pay | Admitting: Orthopaedic Surgery

## 2017-07-23 ENCOUNTER — Ambulatory Visit (INDEPENDENT_AMBULATORY_CARE_PROVIDER_SITE_OTHER): Payer: BLUE CROSS/BLUE SHIELD | Admitting: Orthopaedic Surgery

## 2017-07-23 ENCOUNTER — Other Ambulatory Visit (INDEPENDENT_AMBULATORY_CARE_PROVIDER_SITE_OTHER): Payer: Self-pay | Admitting: Orthopaedic Surgery

## 2017-07-23 DIAGNOSIS — F411 Generalized anxiety disorder: Secondary | ICD-10-CM | POA: Diagnosis not present

## 2017-07-23 DIAGNOSIS — M1711 Unilateral primary osteoarthritis, right knee: Secondary | ICD-10-CM

## 2017-07-23 DIAGNOSIS — Z9889 Other specified postprocedural states: Secondary | ICD-10-CM

## 2017-07-23 MED ORDER — DICLOFENAC SODIUM 1 % TD GEL: 2.0000 g | Freq: Four times a day (QID) | TRANSDERMAL | 3 refills | Status: DC

## 2017-07-23 NOTE — Progress Notes (Signed)
The patient is now 3 weeks status post a right knee arthroscopy.  We found a large medial meniscal tear but wide areas of full-thickness cartilage loss in the medial compartment of his knee.  We did place a steroid injection in his knee about 2 weeks ago and he said that has not really help much at all.  We were hoping to provide a hyaluronic acid injection as the next step for his knee but his insurance company has denied this.  I talked him and I feel that this is likely due to the fact that research shows that there is a wide cartilage loss and a knee joint do not usually respond to hyaluronic acid and that his insurance company is probably going with with the stated research shows.  He understands from my standpoint the other other option to treat pain and arthritis such as this would be a knee replacement.  He understands this fully.  On examination of his right knee there is no significant effusion with good range of motion overall.  He has medial pain.  At this point we will see him back in 3 months to see how is doing overall.  All questions concerns were answered and addressed.

## 2017-07-24 ENCOUNTER — Ambulatory Visit: Payer: BLUE CROSS/BLUE SHIELD | Admitting: Internal Medicine

## 2017-07-24 ENCOUNTER — Encounter: Payer: Self-pay | Admitting: Internal Medicine

## 2017-07-24 VITALS — BP 104/68 | HR 68 | Ht 71.75 in | Wt 220.5 lb

## 2017-07-24 DIAGNOSIS — K2100 Gastro-esophageal reflux disease with esophagitis, without bleeding: Secondary | ICD-10-CM

## 2017-07-24 DIAGNOSIS — K200 Eosinophilic esophagitis: Secondary | ICD-10-CM

## 2017-07-24 NOTE — Progress Notes (Signed)
Marcus Watson 57 y.o. 09-06-1960 [id number]  Assessment & Plan:   Encounter Diagnoses  Name Primary?  . Eosinophilic esophagitis Yes  . Gastroesophageal reflux disease with esophagitis     Stay on PPI - can see how he does on 40 mg pantoprazole qd vs bid  His chronic regurgitation occurs for reasons unclear to me Has had "my whole life basically"I do not think its a manifestation of EoE but suppose it could be  He tolerates it and PPI, dilations and even a course of ingested budesonide have not changes it but the bad dysphagia he was having is gone  F/U 1 year sooner prn   Subjective:   Chief Complaint: regurgitation  HPI Here for f/u still having intermittent episodic regurgitation of food - has had 2 esophageal dilation and a negative Ba swallow. Does have EoE and is on PPI - says recent Rx now pantoprazole 40 mg qd not bid About to go to [place] x 3 mos for work  Allergies  Allergen Reactions  . Penicillins Anaphylaxis    Has patient had a PCN reaction causing immediate rash, facial/tongue/throat swelling, SOB or lightheadedness with hypotension: No Has patient had a PCN reaction causing severe rash involving mucus membranes or skin necrosis: No Has patient had a PCN reaction that required hospitalization: No Has patient had a PCN reaction occurring within the last 10 years: No If all of the above answers are "NO", then may proceed with Cephalosporin use.   Current Meds  Medication Sig  . albuterol (PROVENTIL HFA;VENTOLIN HFA) 108 (90 Base) MCG/ACT inhaler Inhale 1 puff into the lungs as needed for wheezing.   . budesonide (RHINOCORT AQUA) 32 MCG/ACT nasal spray Place 1-2 sprays into the nose See admin instructions. Two sprays the morning and one and night  . cetirizine (ZYRTEC) 10 MG tablet Take 10 mg by mouth at bedtime.   . Cholecalciferol (VITAMIN D-1000 MAX ST) 1000 units tablet Take 1,000 Units by mouth daily.  . diclofenac sodium (VOLTAREN) 1 % GEL Apply  2 g topically 4 (four) times daily.  [name] DYMISTA 137-50 MCG/ACT SUSP   . glucosamine-chondroitin 500-400 MG tablet Take 1 tablet by mouth daily.  . pantoprazole (PROTONIX) 40 MG tablet Take 1 tablet (40 mg total) by mouth daily.  . sildenafil (REVATIO) 20 MG tablet Take 20 mg by mouth daily.  [name] testosterone cypionate (DEPO-TESTOSTERONE) 200 MG/ML injection Inject 200 mg into the muscle See admin instructions. Every two weeks  . [DISCONTINUED] pantoprazole (PROTONIX) 40 MG tablet Take 1 tablet (40 mg total) by mouth 2 (two) times daily before a meal. (Patient taking differently: Take 40 mg by mouth daily. )   Past Medical History:  Diagnosis Date  . Allergy   . Eosinophilic esophagitis 02/19/8545  . Erectile dysfunction   . Esophageal laceration 12/04/2016  . Food impaction of esophagus 12/04/2016  . GERD with stricture   . Melanoma ([place])    back  . Mild asthma   . Seasonal allergies   . Testosterone deficiency    Past Surgical History:  Procedure Laterality Date  . [place]ONOSCOPY  2015   in [place]  . ESOPHAGOGASTRODUODENOSCOPY N/A 12/04/2016   Procedure: ESOPHAGOGASTRODUODENOSCOPY (EGD);  Surgeon: [name], MD;  Location: [facility];  Service: Endoscopy;  Laterality: N/A;  . HERNIA REPAIR    . KNEE ARTHROCENTESIS    . KNEE ARTHROSCOPY Right   . NASAL SINUS SURGERY    . TONSILLECTOMY     Social History  Social History Narrative   Married - from [place] area - moved to [place] approx 2015 to work for [employer]   family history includes Hypertension in his other.   Review of Systems As above  Objective:   Physical Exam  BP 104/68 (BP Location: Left Arm, Patient Position: Sitting, Cuff Size: Normal)   Pulse 68   Ht 5' 11.75" (1.822 m)   Wt 220 lb 8 oz (100 kg)   BMI 30.11 kg/m  NAD

## 2017-07-24 NOTE — Patient Instructions (Signed)
If you are age 57 or older, your body mass index should be between 23-30. Your Body mass index is 30.11 kg/m. If this is out of the aforementioned range listed, please consider follow up with your Primary Care Provider.  If you are age 86 or younger, your body mass index should be between 19-25. Your Body mass index is 30.11 kg/m. If this is out of the aformentioned range listed, please consider follow up with your Primary Care Provider.   Please follow up with with Dr Carlean Purl in 1 year sooner if needed.  Thank you for choosing me and [facility].  [name], MD, [facility]

## 2017-07-25 DIAGNOSIS — R7989 Other specified abnormal findings of blood chemistry: Secondary | ICD-10-CM | POA: Diagnosis not present

## 2017-07-25 DIAGNOSIS — N400 Enlarged prostate without lower urinary tract symptoms: Secondary | ICD-10-CM | POA: Diagnosis not present

## 2017-10-08 ENCOUNTER — Ambulatory Visit (INDEPENDENT_AMBULATORY_CARE_PROVIDER_SITE_OTHER): Payer: BLUE CROSS/BLUE SHIELD | Admitting: Orthopaedic Surgery

## 2017-11-04 ENCOUNTER — Ambulatory Visit (INDEPENDENT_AMBULATORY_CARE_PROVIDER_SITE_OTHER): Payer: BLUE CROSS/BLUE SHIELD | Admitting: Orthopaedic Surgery

## 2017-11-06 ENCOUNTER — Encounter (INDEPENDENT_AMBULATORY_CARE_PROVIDER_SITE_OTHER): Payer: Self-pay | Admitting: Orthopaedic Surgery

## 2017-11-06 ENCOUNTER — Ambulatory Visit (INDEPENDENT_AMBULATORY_CARE_PROVIDER_SITE_OTHER): Payer: BLUE CROSS/BLUE SHIELD | Admitting: Orthopaedic Surgery

## 2017-11-06 DIAGNOSIS — M1711 Unilateral primary osteoarthritis, right knee: Secondary | ICD-10-CM

## 2017-11-06 NOTE — Progress Notes (Signed)
Office Visit Note   Patient: Marcus Watson           Date of Birth: [birth year]           MRN: [id number] Visit Date: 11/06/2017              Requested by: Vernie Shanks, MD [place], [place] [address] PCP: Vernie Shanks, MD   Assessment & Plan: Visit Diagnoses:  1. Unilateral primary osteoarthritis, right knee     Plan: Due to patient's continued pain despite conservative measures which included time, exercise over-the-counter anti-inflammatories, injections and knee arthroscopy Recommend right total knee arthroplasty.  Patient would like to proceed in the near future.  Questions encouraged and answered at length by Dr. Ninfa Linden and myself.  Postoperative protocol reviewed with patient.  Risk benefits including but not limited to DVT/PE, blood loss, wound healing problems, prolonged pain worsening pain nerve and vessel injury all reviewed with patient.  Follow-Up Instructions: Return for  2 weeks post op.   Orders:  No orders of the defined types were placed in this encounter.  No orders of the defined types were placed in this encounter.     Procedures: No procedures performed   Clinical Data: No additional findings.   Subjective: Chief Complaint  Patient presents with  . Right Knee - Follow-up    HPI Marcus Watson returns today follow-up of his right knee.  Again he had a right knee arthroscopy in January of this year was found to have large areas of full-thickness cartilage loss medial compartment and grade IV chondromalacia patellofemoral compartment.  Despite conservative treatment he continues to have severe pain in his knee.  He states he is having swelling.  States he just really cannot live with the pain.  He was in [place] for the last 3 months and doing a lot of walking up and down stairs and had difficulty.  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  No history of PE DVT Review of Systems No fevers chills shortness of breath chest  pain.  Objective: Vital Signs: There were no vitals taken for this visit.  Physical Exam  Constitutional: He is oriented to person, place, and time. He appears well-developed and well-nourished. No distress.  Pulmonary/Chest: Effort normal.  Neurological: He is alert and oriented to person, place, and time.  Skin: He is not diaphoretic.  Psychiatric: He has a normal mood and affect.    Ortho Exam Edema right knee compared to left No erythema or ecchymosis.  Specialty Comments:  No specialty comments available.  Imaging: No results found.   PMFS History: Patient Active Problem List   Diagnosis Date Noted  . Status post arthroscopy of right knee 07/10/2017  . Unilateral primary osteoarthritis, right knee 07/10/2017  . GERD with stricture 03/19/2017  . Pain in right hip 03/04/2017  . Acute pain of right knee 03/04/2017  . Trochanteric bursitis, right hip 03/04/2017  . Eosinophilic esophagitis 38/75/6433   Past Medical History:  Diagnosis Date  . Allergy   . Eosinophilic esophagitis 07/27/5186  . Erectile dysfunction   . Esophageal laceration 12/04/2016  . Food impaction of esophagus 12/04/2016  . GERD with stricture   . Melanoma ([place])    back  . Mild asthma   . Seasonal allergies   . Testosterone deficiency     Family History  Problem Relation Age of Onset  . Hypertension Other   . Colon cancer Neg Hx   .  Esophageal cancer Neg Hx   . Rectal cancer Neg Hx   . Stomach cancer Neg Hx     Past Surgical History:  Procedure Laterality Date  . [place]  2015   in [place]  . ESOPHAGOGASTRODUODENOSCOPY N/A 12/04/2016   Procedure: ESOPHAGOGASTRODUODENOSCOPY (EGD);  Surgeon: [name], MD;  Location: [facility] END[facility]COPY;  Service: Endoscopy;  Laterality: N/A;  . HERNIA REPAIR    . KNEE ARTHROCENTESIS    . KNEE ARTHROSCOPY Right   . NASAL SINUS SURGERY    . TONSILLECTOMY     Social History   Occupational History    Employer: [employer]  Tobacco Use  .  Smoking status: Never Smoker  . Smokeless tobacco: Former Systems developer    Types: Snuff  Substance and Sexual Activity  . Alcohol use: Yes    Comment: occassional  . Drug use: No  . Sexual activity: Not on file

## 2017-11-12 ENCOUNTER — Encounter (INDEPENDENT_AMBULATORY_CARE_PROVIDER_SITE_OTHER): Payer: Self-pay | Admitting: Orthopaedic Surgery

## 2017-11-13 ENCOUNTER — Other Ambulatory Visit (INDEPENDENT_AMBULATORY_CARE_PROVIDER_SITE_OTHER): Payer: Self-pay | Admitting: Physician Assistant

## 2017-11-15 NOTE — Patient Instructions (Addendum)
Marcus Watson  11/15/2017   Your procedure is scheduled on: 11-22-17   Report to [facility] Main  Entrance    Report to Admitting at 5:30 AM    Call this number if you have problems the morning of surgery [phone]   Remember: Do not eat food or drink liquids :After Midnight.     Take these medicines the morning of surgery with A SIP OF WATER: Pantoprazole (Protonix). You may also bring and use your inhaler and nasal spray as needed.                                You may not have any metal on your body including hair pins and              piercings  Do not wear jewelry, lotions, powders or deodorant             Men may shave face and neck.   Do not bring valuables to the hospital. [place].  Contacts, dentures or bridgework may not be worn into surgery.  Leave suitcase in the car. After surgery it may be brought to your room.     Special Instructions: N/A              Please read over the following fact sheets you were given: _____________________________________________________________________           [facility] - Preparing for Surgery Before surgery, you can play an important role.  Because skin is not sterile, your skin needs to be as free of germs as possible.  You can reduce the number of germs on your skin by washing with CHG (chlorahexidine gluconate) soap before surgery.  CHG is an antiseptic cleaner which kills germs and bonds with the skin to continue killing germs even after washing. Please DO NOT use if you have an allergy to CHG or antibacterial soaps.  If your skin becomes reddened/irritated stop using the CHG and inform your nurse when you arrive at Short Stay. Do not shave (including legs and underarms) for at least 48 hours prior to the first CHG shower.  You may shave your face/neck. Please follow these instructions carefully:  1.  Shower with CHG Soap the night before surgery and  the  morning of Surgery.  2.  If you choose to wash your hair, wash your hair first as usual with your  normal  shampoo.  3.  After you shampoo, rinse your hair and body thoroughly to remove the  shampoo.                           4.  Use CHG as you would any other liquid soap.  You can apply chg directly  to the skin and wash                       Gently with a scrungie or clean washcloth.  5.  Apply the CHG Soap to your body ONLY FROM THE NECK DOWN.   Do not use on face/ open  Wound or open sores. Avoid contact with eyes, ears mouth and genitals (private parts).                       Wash face,  Genitals (private parts) with your normal soap.             6.  Wash thoroughly, paying special attention to the area where your surgery  will be performed.  7.  Thoroughly rinse your body with warm water from the neck down.  8.  DO NOT shower/wash with your normal soap after using and rinsing off  the CHG Soap.                9.  Pat yourself dry with a clean towel.            10.  Wear clean pajamas.            11.  Place clean sheets on your bed the night of your first shower and do not  sleep with pets. Day of Surgery : Do not apply any lotions/deodorants the morning of surgery.  Please wear clean clothes to the hospital/surgery center.  FAILURE TO FOLLOW THESE INSTRUCTIONS MAY RESULT IN THE CANCELLATION OF YOUR SURGERY PATIENT SIGNATURE_________________________________  NURSE SIGNATURE__________________________________  ________________________________________________________________________   Adam Phenix  An incentive spirometer is a tool that can help keep your lungs clear and active. This tool measures how well you are filling your lungs with each breath. Taking long deep breaths may help reverse or decrease the chance of developing breathing (pulmonary) problems (especially infection) following:  A long period of time when you are unable to move or be  active. BEFORE THE PROCEDURE   If the spirometer includes an indicator to show your best effort, your nurse or respiratory therapist will set it to a desired goal.  If possible, sit up straight or lean slightly forward. Try not to slouch.  Hold the incentive spirometer in an upright position. INSTRUCTIONS FOR USE  1. Sit on the edge of your bed if possible, or sit up as far as you can in bed or on a chair. 2. Hold the incentive spirometer in an upright position. 3. Breathe out normally. 4. Place the mouthpiece in your mouth and seal your lips tightly around it. 5. Breathe in slowly and as deeply as possible, raising the piston or the ball toward the top of the column. 6. Hold your breath for 3-5 seconds or for as long as possible. Allow the piston or ball to fall to the bottom of the column. 7. Remove the mouthpiece from your mouth and breathe out normally. 8. Rest for a few seconds and repeat Steps 1 through 7 at least 10 times every 1-2 hours when you are awake. Take your time and take a few normal breaths between deep breaths. 9. The spirometer may include an indicator to show your best effort. Use the indicator as a goal to work toward during each repetition. 10. After each set of 10 deep breaths, practice coughing to be sure your lungs are clear. If you have an incision (the cut made at the time of surgery), support your incision when coughing by placing a pillow or rolled up towels firmly against it. Once you are able to get out of bed, walk around indoors and cough well. You may stop using the incentive spirometer when instructed by your caregiver.  RISKS AND COMPLICATIONS  Take your time so you do not get  dizzy or light-headed.  If you are in pain, you may need to take or ask for pain medication before doing incentive spirometry. It is harder to take a deep breath if you are having pain. AFTER USE  Rest and breathe slowly and easily.  It can be helpful to keep track of a log of  your progress. Your caregiver can provide you with a simple table to help with this. If you are using the spirometer at home, follow these instructions: [place] IF:   You are having difficultly using the spirometer.  You have trouble using the spirometer as often as instructed.  Your pain medication is not giving enough relief while using the spirometer.  You develop fever of 100.5 F (38.1 C) or higher. SEEK IMMEDIATE MEDICAL CARE IF:   You cough up bloody sputum that had not been present before.  You develop fever of 102 F (38.9 C) or greater.  You develop worsening pain at or near the incision site. MAKE SURE YOU:   Understand these instructions.  Will watch your condition.  Will get help right away if you are not doing well or get worse. Document Released: 10/15/2006 Document Revised: 08/27/2011 Document Reviewed: 12/16/2006 [facility] Patient Information 2014 [place], [place].   ________________________________________________________________________

## 2017-11-15 NOTE — Progress Notes (Signed)
12-04-16 (Epic) EKG

## 2017-11-18 ENCOUNTER — Encounter (HOSPITAL_COMMUNITY)
Admission: RE | Admit: 2017-11-18 | Discharge: 2017-11-18 | Disposition: A | Discharge status: Home / Self Care | Payer: BLUE CROSS/BLUE SHIELD | Source: Ambulatory Visit | Attending: Orthopaedic Surgery | Admitting: Orthopaedic Surgery

## 2017-11-18 ENCOUNTER — Encounter (HOSPITAL_COMMUNITY): Payer: Self-pay

## 2017-11-18 ENCOUNTER — Encounter (INDEPENDENT_AMBULATORY_CARE_PROVIDER_SITE_OTHER): Payer: Self-pay | Admitting: Orthopaedic Surgery

## 2017-11-18 ENCOUNTER — Other Ambulatory Visit: Payer: Self-pay

## 2017-11-18 DIAGNOSIS — Z01812 Encounter for preprocedural laboratory examination: Secondary | ICD-10-CM | POA: Diagnosis not present

## 2017-11-18 DIAGNOSIS — M1711 Unilateral primary osteoarthritis, right knee: Secondary | ICD-10-CM | POA: Diagnosis not present

## 2017-11-18 LAB — CBC
HCT: 46.6 % (ref 39.0–52.0)
Hemoglobin: 15.9 g/dL (ref 13.0–17.0)
MCH: 31.4 pg (ref 26.0–34.0)
MCHC: 34.1 g/dL (ref 30.0–36.0)
MCV: 92.1 fL (ref 78.0–100.0)
Platelets: 287 10*3/uL (ref 150–400)
RBC: 5.06 MIL/uL (ref 4.22–5.81)
RDW: 13.2 % (ref 11.5–15.5)
WBC: 7.3 10*3/uL (ref 4.0–10.5)

## 2017-11-18 LAB — BASIC METABOLIC PANEL
Anion gap: 10 (ref 5–15)
BUN: 11 mg/dL (ref 6–20)
CO2: 23 mmol/L (ref 22–32)
Calcium: 9.1 mg/dL (ref 8.9–10.3)
Chloride: 106 mmol/L (ref 101–111)
Creatinine, Ser: 0.96 mg/dL (ref 0.61–1.24)
GFR calc Af Amer: >60 mL/min (ref >60)
GFR calc non Af Amer: >60 mL/min (ref >60)
Glucose, Bld: 98 mg/dL (ref 65–99)
Potassium: 4.2 mmol/L (ref 3.5–5.1)
Sodium: 139 mmol/L (ref 135–145)

## 2017-11-18 LAB — SURGICAL PCR SCREEN
MRSA, PCR: NEGATIVE
Staphylococcus aureus: POSITIVE — AB

## 2017-11-18 NOTE — Progress Notes (Signed)
11-18-17 PCR result routed to Dr. Ninfa Linden for review.

## 2017-11-21 ENCOUNTER — Other Ambulatory Visit (INDEPENDENT_AMBULATORY_CARE_PROVIDER_SITE_OTHER): Payer: Self-pay

## 2017-11-21 MED ORDER — TRANEXAMIC ACID 1000 MG/10ML IV SOLN
1000.0000 mg | INTRAVENOUS | Status: AC
  Administered 2017-11-22: 1000 mg via INTRAVENOUS
  Filled 2017-11-21: qty 1100

## 2017-11-22 ENCOUNTER — Inpatient Hospital Stay (HOSPITAL_COMMUNITY): Payer: BLUE CROSS/BLUE SHIELD

## 2017-11-22 ENCOUNTER — Encounter (HOSPITAL_COMMUNITY): Payer: Self-pay | Admitting: Emergency Medicine

## 2017-11-22 ENCOUNTER — Inpatient Hospital Stay (HOSPITAL_COMMUNITY): Payer: BLUE CROSS/BLUE SHIELD | Admitting: Certified Registered Nurse Anesthetist

## 2017-11-22 ENCOUNTER — Other Ambulatory Visit: Payer: Self-pay

## 2017-11-22 ENCOUNTER — Inpatient Hospital Stay (HOSPITAL_COMMUNITY)
Admission: RE | Admit: 2017-11-22 | Discharge: 2017-11-24 | DRG: 470 | Disposition: A | Discharge status: Home Health | Payer: BLUE CROSS/BLUE SHIELD | Attending: Orthopaedic Surgery | Admitting: Orthopaedic Surgery

## 2017-11-22 ENCOUNTER — Encounter (HOSPITAL_COMMUNITY)
Admission: RE | Disposition: A | Discharge status: Home Health | Payer: Self-pay | Source: Other Acute Inpatient Hospital | Attending: Orthopaedic Surgery

## 2017-11-22 DIAGNOSIS — M659 Synovitis and tenosynovitis, unspecified: Secondary | ICD-10-CM | POA: Diagnosis not present

## 2017-11-22 DIAGNOSIS — Z471 Aftercare following joint replacement surgery: Secondary | ICD-10-CM | POA: Diagnosis not present

## 2017-11-22 DIAGNOSIS — Z7951 Long term (current) use of inhaled steroids: Secondary | ICD-10-CM

## 2017-11-22 DIAGNOSIS — Z9181 History of falling: Secondary | ICD-10-CM | POA: Diagnosis not present

## 2017-11-22 DIAGNOSIS — Z79890 Hormone replacement therapy (postmenopausal): Secondary | ICD-10-CM

## 2017-11-22 DIAGNOSIS — Z87891 Personal history of nicotine dependence: Secondary | ICD-10-CM

## 2017-11-22 DIAGNOSIS — M1711 Unilateral primary osteoarthritis, right knee: Secondary | ICD-10-CM | POA: Diagnosis not present

## 2017-11-22 DIAGNOSIS — Z79899 Other long term (current) drug therapy: Secondary | ICD-10-CM

## 2017-11-22 DIAGNOSIS — K219 Gastro-esophageal reflux disease without esophagitis: Secondary | ICD-10-CM | POA: Diagnosis not present

## 2017-11-22 DIAGNOSIS — J45909 Unspecified asthma, uncomplicated: Secondary | ICD-10-CM | POA: Diagnosis not present

## 2017-11-22 DIAGNOSIS — Z880 Allergy status to penicillin: Secondary | ICD-10-CM | POA: Diagnosis not present

## 2017-11-22 DIAGNOSIS — Z7982 Long term (current) use of aspirin: Secondary | ICD-10-CM | POA: Diagnosis not present

## 2017-11-22 DIAGNOSIS — Z96651 Presence of right artificial knee joint: Secondary | ICD-10-CM | POA: Diagnosis not present

## 2017-11-22 DIAGNOSIS — Z91048 Other nonmedicinal substance allergy status: Secondary | ICD-10-CM

## 2017-11-22 DIAGNOSIS — L03115 Cellulitis of right lower limb: Secondary | ICD-10-CM | POA: Diagnosis not present

## 2017-11-22 DIAGNOSIS — T8149XA Infection following a procedure, other surgical site, initial encounter: Secondary | ICD-10-CM | POA: Diagnosis not present

## 2017-11-22 DIAGNOSIS — G8918 Other acute postprocedural pain: Secondary | ICD-10-CM | POA: Diagnosis not present

## 2017-11-22 DIAGNOSIS — Z9889 Other specified postprocedural states: Secondary | ICD-10-CM | POA: Diagnosis not present

## 2017-11-22 DIAGNOSIS — R600 Localized edema: Secondary | ICD-10-CM | POA: Diagnosis not present

## 2017-11-22 DIAGNOSIS — M25561 Pain in right knee: Secondary | ICD-10-CM | POA: Diagnosis not present

## 2017-11-22 DIAGNOSIS — Z79891 Long term (current) use of opiate analgesic: Secondary | ICD-10-CM | POA: Diagnosis not present

## 2017-11-22 HISTORY — PX: TOTAL KNEE ARTHROPLASTY: SHX125

## 2017-11-22 SURGERY — ARTHROPLASTY, KNEE, TOTAL
Anesthesia: Spinal | Site: Knee | Laterality: Right

## 2017-11-22 MED ORDER — PROPOFOL 500 MG/50ML IV EMUL
INTRAVENOUS | Status: DC | PRN
  Administered 2017-11-22: 30 mg via INTRAVENOUS

## 2017-11-22 MED ORDER — METOCLOPRAMIDE HCL 5 MG PO TABS: 5.0000 mg | ORAL_TABLET | Freq: Three times a day (TID) | ORAL | Status: DC | PRN

## 2017-11-22 MED ORDER — ONDANSETRON HCL 4 MG/2ML IJ SOLN: 4.0000 mg | Freq: Once | INTRAMUSCULAR | Status: DC | PRN

## 2017-11-22 MED ORDER — PROPOFOL 10 MG/ML IV BOLUS
INTRAVENOUS | Status: AC
  Filled 2017-11-22: qty 60

## 2017-11-22 MED ORDER — HYDROMORPHONE HCL 1 MG/ML IJ SOLN: 0.2500 mg | INTRAMUSCULAR | Status: DC | PRN

## 2017-11-22 MED ORDER — OXYCODONE HCL 5 MG PO TABS
5.0000 mg | ORAL_TABLET | ORAL | Status: DC | PRN
  Administered 2017-11-22: 10 mg via ORAL
  Filled 2017-11-22: qty 2

## 2017-11-22 MED ORDER — POLYETHYLENE GLYCOL 3350 17 G PO PACK: 17.0000 g | PACK | Freq: Every day | ORAL | Status: DC | PRN

## 2017-11-22 MED ORDER — BUPIVACAINE HCL (PF) 0.75 % IJ SOLN
INTRAMUSCULAR | Status: DC | PRN
  Administered 2017-11-22: 2 mL via INTRATHECAL

## 2017-11-22 MED ORDER — PROPOFOL 10 MG/ML IV BOLUS
INTRAVENOUS | Status: AC
  Filled 2017-11-22: qty 20

## 2017-11-22 MED ORDER — VITAMIN D 1000 UNITS PO TABS
1000.0000 [IU] | ORAL_TABLET | Freq: Every day | ORAL | Status: DC
  Administered 2017-11-22 – 2017-11-24 (×3): 1000 [IU] via ORAL
  Filled 2017-11-22 (×3): qty 1

## 2017-11-22 MED ORDER — GABAPENTIN 100 MG PO CAPS
100.0000 mg | ORAL_CAPSULE | Freq: Three times a day (TID) | ORAL | Status: DC
  Administered 2017-11-22 – 2017-11-24 (×6): 100 mg via ORAL
  Filled 2017-11-22 (×6): qty 1

## 2017-11-22 MED ORDER — DIPHENHYDRAMINE HCL 12.5 MG/5ML PO ELIX: 12.5000 mg | ORAL_SOLUTION | ORAL | Status: DC | PRN

## 2017-11-22 MED ORDER — PHENOL 1.4 % MT LIQD
1.0000 | OROMUCOSAL | Status: DC | PRN
  Filled 2017-11-22: qty 177

## 2017-11-22 MED ORDER — MENTHOL 3 MG MT LOZG: 1.0000 | LOZENGE | OROMUCOSAL | Status: DC | PRN

## 2017-11-22 MED ORDER — FENTANYL CITRATE (PF) 100 MCG/2ML IJ SOLN
INTRAMUSCULAR | Status: DC | PRN
  Administered 2017-11-22: 100 ug via INTRAVENOUS

## 2017-11-22 MED ORDER — FENTANYL CITRATE (PF) 100 MCG/2ML IJ SOLN
INTRAMUSCULAR | Status: AC
  Filled 2017-11-22: qty 2

## 2017-11-22 MED ORDER — ASPIRIN 81 MG PO CHEW
81.0000 mg | CHEWABLE_TABLET | Freq: Two times a day (BID) | ORAL | Status: DC
  Administered 2017-11-22 – 2017-11-24 (×4): 81 mg via ORAL
  Filled 2017-11-22 (×4): qty 1

## 2017-11-22 MED ORDER — LACTATED RINGERS IV SOLN
INTRAVENOUS | Status: DC
  Administered 2017-11-22 (×3): via INTRAVENOUS

## 2017-11-22 MED ORDER — STERILE WATER FOR IRRIGATION IR SOLN
Status: DC | PRN
Start: 2017-11-22 — End: 2017-11-22
  Administered 2017-11-22: 2000 mL

## 2017-11-22 MED ORDER — ONDANSETRON HCL 4 MG/2ML IJ SOLN: 4.0000 mg | Freq: Four times a day (QID) | INTRAMUSCULAR | Status: DC | PRN

## 2017-11-22 MED ORDER — METHOCARBAMOL 500 MG PO TABS
500.0000 mg | ORAL_TABLET | Freq: Four times a day (QID) | ORAL | Status: DC | PRN
  Administered 2017-11-22 – 2017-11-24 (×8): 500 mg via ORAL
  Filled 2017-11-22 (×8): qty 1

## 2017-11-22 MED ORDER — OXYCODONE HCL 5 MG PO TABS
10.0000 mg | ORAL_TABLET | ORAL | Status: DC | PRN
  Administered 2017-11-22 – 2017-11-24 (×10): 15 mg via ORAL
  Filled 2017-11-22 (×10): qty 3

## 2017-11-22 MED ORDER — MIDAZOLAM HCL 2 MG/2ML IJ SOLN
INTRAMUSCULAR | Status: AC
  Filled 2017-11-22: qty 2

## 2017-11-22 MED ORDER — AZELASTINE-FLUTICASONE 137-50 MCG/ACT NA SUSP: 1.0000 | Freq: Every day | NASAL | Status: DC

## 2017-11-22 MED ORDER — ROPIVACAINE HCL 7.5 MG/ML IJ SOLN: INTRAMUSCULAR | Status: DC | PRN

## 2017-11-22 MED ORDER — 0.9 % SODIUM CHLORIDE (POUR BTL) OPTIME
TOPICAL | Status: DC | PRN
  Administered 2017-11-22: 1000 mL

## 2017-11-22 MED ORDER — METOCLOPRAMIDE HCL 5 MG/ML IJ SOLN: 5.0000 mg | Freq: Three times a day (TID) | INTRAMUSCULAR | Status: DC | PRN

## 2017-11-22 MED ORDER — METHOCARBAMOL 1000 MG/10ML IJ SOLN
500.0000 mg | Freq: Four times a day (QID) | INTRAVENOUS | Status: DC | PRN
  Administered 2017-11-22: 500 mg via INTRAVENOUS
  Filled 2017-11-22: qty 550

## 2017-11-22 MED ORDER — CLINDAMYCIN PHOSPHATE 900 MG/50ML IV SOLN
900.0000 mg | INTRAVENOUS | Status: AC
  Administered 2017-11-22: 900 mg via INTRAVENOUS
  Filled 2017-11-22: qty 50

## 2017-11-22 MED ORDER — PROPOFOL 500 MG/50ML IV EMUL
INTRAVENOUS | Status: DC | PRN
  Administered 2017-11-22: 75 ug/kg/min via INTRAVENOUS

## 2017-11-22 MED ORDER — SODIUM CHLORIDE 0.9 % IV SOLN
INTRAVENOUS | Status: DC
  Administered 2017-11-22: 14:00:00 via INTRAVENOUS

## 2017-11-22 MED ORDER — ROPIVACAINE HCL 7.5 MG/ML IJ SOLN
INTRAMUSCULAR | Status: DC | PRN
  Administered 2017-11-22: 20 mL via PERINEURAL

## 2017-11-22 MED ORDER — SODIUM CHLORIDE 0.9 % IR SOLN
Status: DC | PRN
  Administered 2017-11-22: 2000 mL

## 2017-11-22 MED ORDER — ONDANSETRON HCL 4 MG PO TABS: 4.0000 mg | ORAL_TABLET | Freq: Four times a day (QID) | ORAL | Status: DC | PRN

## 2017-11-22 MED ORDER — HYDROMORPHONE HCL 1 MG/ML IJ SOLN
0.5000 mg | INTRAMUSCULAR | Status: DC | PRN
  Administered 2017-11-22 – 2017-11-23 (×8): 1 mg via INTRAVENOUS
  Filled 2017-11-22 (×8): qty 1

## 2017-11-22 MED ORDER — MEPERIDINE HCL 50 MG/ML IJ SOLN: 6.2500 mg | INTRAMUSCULAR | Status: DC | PRN

## 2017-11-22 MED ORDER — CLINDAMYCIN PHOSPHATE 600 MG/50ML IV SOLN
600.0000 mg | Freq: Four times a day (QID) | INTRAVENOUS | Status: AC
  Administered 2017-11-22 (×2): 600 mg via INTRAVENOUS
  Filled 2017-11-22 (×2): qty 50

## 2017-11-22 MED ORDER — ACETAMINOPHEN 325 MG PO TABS
325.0000 mg | ORAL_TABLET | Freq: Four times a day (QID) | ORAL | Status: DC | PRN
  Administered 2017-11-24: 650 mg via ORAL
  Filled 2017-11-22: qty 2

## 2017-11-22 MED ORDER — BUPIVACAINE IN DEXTROSE 0.75-8.25 % IT SOLN
INTRATHECAL | Status: DC | PRN
  Administered 2017-11-22: 2 mL via INTRATHECAL

## 2017-11-22 MED ORDER — ALUM & MAG HYDROXIDE-SIMETH 200-200-20 MG/5ML PO SUSP: 30.0000 mL | ORAL | Status: DC | PRN

## 2017-11-22 MED ORDER — CHLORHEXIDINE GLUCONATE 4 % EX LIQD: 60.0000 mL | Freq: Once | CUTANEOUS | Status: DC

## 2017-11-22 MED ORDER — PANTOPRAZOLE SODIUM 40 MG PO TBEC
40.0000 mg | DELAYED_RELEASE_TABLET | Freq: Two times a day (BID) | ORAL | Status: DC
  Administered 2017-11-22 – 2017-11-24 (×4): 40 mg via ORAL
  Filled 2017-11-22 (×4): qty 1

## 2017-11-22 MED ORDER — MIDAZOLAM HCL 5 MG/5ML IJ SOLN
INTRAMUSCULAR | Status: DC | PRN
  Administered 2017-11-22: 2 mg via INTRAVENOUS

## 2017-11-22 MED ORDER — DOCUSATE SODIUM 100 MG PO CAPS
100.0000 mg | ORAL_CAPSULE | Freq: Two times a day (BID) | ORAL | Status: DC
  Administered 2017-11-22 – 2017-11-24 (×4): 100 mg via ORAL
  Filled 2017-11-22 (×4): qty 1

## 2017-11-22 MED ORDER — ZOLPIDEM TARTRATE 5 MG PO TABS: 5.0000 mg | ORAL_TABLET | Freq: Every evening | ORAL | Status: DC | PRN

## 2017-11-22 SURGICAL SUPPLY — 49 items
BAG ZIPLOCK 12X15 (MISCELLANEOUS) ×2 IMPLANT
BANDAGE ACE 4X5 VEL STRL LF (GAUZE/BANDAGES/DRESSINGS) ×2 IMPLANT
BANDAGE ACE 6X5 VEL STRL LF (GAUZE/BANDAGES/DRESSINGS) ×2 IMPLANT
BEARIN INSERT TIBIAL TRIA #5 (Orthopedic Implant) ×2 IMPLANT
BEARING INSERT TIBIAL TRIA #5 (Orthopedic Implant) ×1 IMPLANT
BENZOIN TINCTURE PRP APPL 2/3 (GAUZE/BANDAGES/DRESSINGS) ×2 IMPLANT
BLADE SAG 18X100X1.27 (BLADE) ×2 IMPLANT
COVER SURGICAL LIGHT HANDLE (MISCELLANEOUS) ×2 IMPLANT
CUFF TOURN SGL QUICK 34 (TOURNIQUET CUFF) ×1
CUFF TRNQT CYL 34X4X40X1 (TOURNIQUET CUFF) ×1 IMPLANT
DRAPE U-SHAPE 47X51 STRL (DRAPES) ×2 IMPLANT
DURAPREP 26ML APPLICATOR (WOUND CARE) ×2 IMPLANT
ELECT REM PT RETURN 15FT ADLT (MISCELLANEOUS) ×2 IMPLANT
FEMORAL POSTERIOR SZ5 RT (Femur) ×1 IMPLANT
GAUZE SPONGE 4X4 12PLY STRL (GAUZE/BANDAGES/DRESSINGS) ×2 IMPLANT
GLOVE BIO SURGEON STRL SZ7.5 (GLOVE) ×2 IMPLANT
GLOVE BIOGEL PI IND STRL 7.5 (GLOVE) ×4 IMPLANT
GLOVE BIOGEL PI IND STRL 8 (GLOVE) ×3 IMPLANT
GLOVE BIOGEL PI INDICATOR 7.5 (GLOVE) ×4
GLOVE BIOGEL PI INDICATOR 8 (GLOVE) ×3
GLOVE ECLIPSE 8.0 STRL XLNG CF (GLOVE) ×2 IMPLANT
GLOVE SURG SS PI 8.0 STRL IVOR (GLOVE) ×2 IMPLANT
GOWN STRL REUS W/TWL 2XL LVL3 (GOWN DISPOSABLE) ×2 IMPLANT
GOWN STRL REUS W/TWL XL LVL3 (GOWN DISPOSABLE) ×6 IMPLANT
HANDPIECE INTERPULSE COAX TIP (DISPOSABLE) ×1
HOLDER FOLEY CATH W/STRAP (MISCELLANEOUS) ×2 IMPLANT
IMMOBILIZER KNEE 20 (SOFTGOODS) ×4 IMPLANT
IMMOBILIZER KNEE 20 THIGH 36 (SOFTGOODS) ×1 IMPLANT
KNEE PATELLA ASYMMETRIC 10X32 (Knees) ×2 IMPLANT
KNEE TIBIAL COMPONENT SZ5 (Knees) ×2 IMPLANT
PACK TOTAL KNEE CUSTOM (KITS) ×2 IMPLANT
PAD ABD 8X10 STRL (GAUZE/BANDAGES/DRESSINGS) ×2 IMPLANT
PADDING CAST COTTON 6X4 STRL (CAST SUPPLIES) ×4 IMPLANT
POSITIONER SURGICAL ARM (MISCELLANEOUS) ×2 IMPLANT
POSTERIOR FEMORAL SZ5 RT (Femur) ×2 IMPLANT
SET HNDPC FAN SPRY TIP SCT (DISPOSABLE) ×1 IMPLANT
SET PAD KNEE POSITIONER (MISCELLANEOUS) ×2 IMPLANT
STAPLER VISISTAT 35W (STAPLE) IMPLANT
STRIP CLOSURE SKIN 1/2X4 (GAUZE/BANDAGES/DRESSINGS) ×4 IMPLANT
SUT MNCRL AB 4-0 PS2 18 (SUTURE) ×2 IMPLANT
SUT VIC AB 0 CT1 27 (SUTURE) ×1
SUT VIC AB 0 CT1 27XBRD ANTBC (SUTURE) ×1 IMPLANT
SUT VIC AB 1 CT1 27 (SUTURE) ×2
SUT VIC AB 1 CT1 27XBRD ANTBC (SUTURE) ×2 IMPLANT
SUT VIC AB 2-0 CT1 27 (SUTURE) ×2
SUT VIC AB 2-0 CT1 TAPERPNT 27 (SUTURE) ×2 IMPLANT
TRAY FOLEY MTR SLVR 16FR STAT (SET/KITS/TRAYS/PACK) ×2 IMPLANT
WRAP KNEE MAXI GEL POST OP (GAUZE/BANDAGES/DRESSINGS) ×2 IMPLANT
YANKAUER SUCT BULB TIP 10FT TU (MISCELLANEOUS) ×2 IMPLANT

## 2017-11-22 NOTE — Anesthesia Procedure Notes (Signed)
Spinal  Patient location during procedure: OR Start time: 11/22/2017 7:22 AM End time: 11/22/2017 7:25 AM Staffing Anesthesiologist: Lillia Abed, MD Performed: anesthesiologist  Preanesthetic Checklist Completed: patient identified, surgical consent, pre-op evaluation, timeout performed, IV checked, risks and benefits discussed and monitors and equipment checked Spinal Block Patient position: sitting Prep: DuraPrep Patient monitoring: heart rate, cardiac monitor, continuous pulse ox and blood pressure Approach: right paramedian Location: L3-4 Injection technique: single-shot Needle Needle type: Pencan  Needle gauge: 24 G Needle length: 9 cm Needle insertion depth: 6 cm

## 2017-11-22 NOTE — Transfer of Care (Signed)
Immediate Anesthesia Transfer of Care Note  Patient: Marcus Watson  Procedure(s) Performed: RIGHT TOTAL KNEE ARTHROPLASTY (Right Knee)  Patient Location: PACU  Anesthesia Type:Spinal  Level of Consciousness: sedated, patient cooperative and responds to stimulation  Airway & Oxygen Therapy: Patient Spontanous Breathing and Patient connected to face mask oxygen  Post-op Assessment: Report given to RN and Post -op Vital signs reviewed and stable  Post vital signs: Reviewed and stable  Last Vitals:  Vitals Value Taken Time  BP 109/64 11/22/2017  9:16 AM  Temp    Pulse 65 11/22/2017  9:16 AM  Resp 11 11/22/2017  9:16 AM  SpO2 100 % 11/22/2017  9:16 AM  Vitals shown include unvalidated device data.  Last Pain:  Vitals:   11/22/17 0617  TempSrc:   PainSc: 5       Patients Stated Pain Goal: 4 (53/[id number])  Complications: No apparent anesthesia complications

## 2017-11-22 NOTE — Brief Op Note (Signed)
11/22/2017  8:50 AM  PATIENT:  Maryann Conners  57 y.o. male  PRE-OPERATIVE DIAGNOSIS:  right knee osteoarthritis  POST-OPERATIVE DIAGNOSIS:  right knee osteoarthritis  PROCEDURE:  Procedure(s) with comments: RIGHT TOTAL KNEE ARTHROPLASTY (Right) - Adductor Block  SURGEON:  Surgeon(s) and Role:    Mcarthur Rossetti, MD - Primary  PHYSICIAN ASSISTANT: Benita Stabile, PA-C  ANESTHESIA:   regional and spinal  EBL:  175 mL   COUNTS:  YES  TOURNIQUET:   Total Tourniquet Time Documented: Thigh (Right) - 50 minutes Total: Thigh (Right) - 50 minutes   DICTATION: .Other Dictation: Dictation Number [id number]  PLAN OF CARE: Admit to inpatient   PATIENT DISPOSITION:  PACU - hemodynamically stable.   Delay start of Pharmacological VTE agent (>24hrs) due to surgical blood loss or risk of bleeding: no

## 2017-11-22 NOTE — Op Note (Signed)
Marcus Watson, Marcus Watson MEDICAL RECORD YP:[id number] ACCOUNT [id number] DATE OF BIRTH:[birth year] FACILITY: [facility] LOCATION: [facility] PHYSICIAN:Orlanda Frankum Kerry [place], MD  OPERATIVE REPORT  DATE OF PROCEDURE:  11/22/2017  PREOPERATIVE DIAGNOSES:  Primary osteoarthritis and degenerative joint disease right knee.  POSTOPERATIVE DIAGNOSES:  Primary osteoarthritis and degenerative joint disease, right knee.  PROCEDURE:  Right total knee arthroplasty.  IMPLANTS:  Stryker press-fit knee system with size 5 press-fit femur, size 5 press-fit tibial tray, 11 mm fixed bearing polyethylene insert, size 32 press fit patellar button.  SURGEON:  Lind Guest. Ninfa Linden, MD  ASSISTANT:   Benita Stabile, PA-C.  ANESTHESIA:   1.  Right lower extremity adductor canal block. 2.  Spinal.  ESTIMATED BLOOD LOSS:  Less  than 100 mL.  TOURNIQUET TIME:  Less than 1 hour.  ANTIBIOTICS:  Clindamycin, 900 mg IV.  COMPLICATIONS:  None.  INDICATIONS:  The patient is a 57 year old gentleman well known to me.  He has debilitating arthritis involving his right knee with recurrent effusions as well.  We have tried multiple injections in his knee and aspirations.  He has tried steroid  injections as well as hyaluronic acid injection.  We performed an arthroscopic intervention of his knee as well.  At this point, his pain is daily and it is detrimentally affecting his activities of daily living, his quality of life, his mobility.  Given  the failure of conservative treatment measures, he does wish to proceed with a total knee arthroplasty.  He understands fully the risk of acute blood loss anemia, nerve and vessel injury, fracture, infection, DVT and implant failure.  He understands the  goals of decreased pain and improve mobility overall improve quality of life.  DESCRIPTION OF PROCEDURE:  After informed consent was obtained, the right knee was marked.  Anesthesia was obtained, a regional block in the holding room.   He was then brought to the operating room and sat up on the operating table where spinal  anesthesia was then obtained.  He was then laid in the supine position.  A Foley catheter was placed and a nonsterile tourniquet was placed around the upper right thigh.  His right thigh, knee, leg and ankle foot were thoroughly prepped and draped with  DuraPrep and sterile drapes.  A time-out was called and he identified correct patient, correct right knee.  We then used an Esmarch to wrap the leg and tourniquet was inflated to 300 mm of pressure.  I then made a direct midline incision over the patella  and carried this proximally and distally.  We dissected down to the knee joint  and carried out a medial parapatellar arthrotomy finding a significant large joint effusion and significant synovitis in his knee.  With the knee in a flexed position, we  removed some synovium from the knee as well as remnants of the ACL, PCL, medial and lateral meniscus and removed osteophytes from around the knee.  Using extramedullary cutting guide based off the tibia, we set this for 9 mm of the proximal tibia cut,  correcting for varus and valgus in a neutral slope.  We made this cut without difficulty.  We then used an intramedullary drill through the notch of the knee and inserted a  10 mm distal femoral cutting block for the right knee at 5 degrees externally rotated.  We made this cut without difficulty and brought the knee back down to full extension with a 9 mm extension block to achieve full extension.  We then went back to the  femur and put our femoral sizing guide based off the epicondylar axis.  We chose a size 5 femur.  We had this rotation at 3 degrees.  We then placed our 4-in-1 cutting block for size 5  made our anterior and posterior cuts followed by our chamfer cuts  and then we made our femoral box cut.  We then went back to the tibia and chose a size 5 tibia for coverage.  We set the rotation off the tibial tubercle  and the femur with slight external rotation.  We did our keel cut off of this.  With the size 5  tibial trial followed by the size 5 femoral trial, we tried a 9 mm and 11 mm fixed bearing polyethylene insert and I was pleased with the 11 mm insert in terms of stability, range of motion.  We then made our patellar cut and drilled 3 holes for a  press-fit size 32 patellar button.  We then removed all instrumentation from the knee and irrigated the knee with normal saline solution.  We then placed our real Stryker Triathlon press-fit tibial tray size 5, followed by the real size 5 right press-fit  femur.  We placed our real 11 mm fixed bearing poly insert and press fit our size 32 patellar button.  We then let the tourniquet down and hemostasis was obtained with electrocautery.  We then closed the arthrotomy with interrupted #1 Vicryl suture  followed by 0 Vicryl in the deep tissue, 2-0 Vicryl, subcutaneous tissue, 4-0 Monocryl subcuticular stitch and Steri-Strips on the skin.  A well-padded sterile dressing was applied and he was taken to the recovery room in stable condition.    All final counts were correct.  There were no complications noted.    Note, Benita Stabile, PA-C assisted entire case.  His assistance was crucial for facilitating all aspects of this case.  AN/NUANCE  D:11/22/2017 T:11/22/2017 JOB:[id number]

## 2017-11-22 NOTE — Anesthesia Preprocedure Evaluation (Signed)
Anesthesia Evaluation  Patient identified by MRN, date of birth, ID band Patient awake    Reviewed: Allergy & Precautions, NPO status , Patient's Chart, lab work & pertinent test results  Airway Mallampati: I  TM Distance: >3 FB Neck ROM: Full    Dental   Pulmonary    Pulmonary exam normal        Cardiovascular Normal cardiovascular exam     Neuro/Psych    GI/Hepatic GERD  Medicated and Controlled,  Endo/Other    Renal/GU      Musculoskeletal   Abdominal   Peds  Hematology   Anesthesia Other Findings   Reproductive/Obstetrics                             Anesthesia Physical Anesthesia Plan  ASA: II  Anesthesia Plan: Spinal   Post-op Pain Management:  Regional for Post-op pain   Induction: Intravenous  PONV Risk Score and Plan: 1 and Treatment may vary due to age or medical condition  Airway Management Planned: Simple Face Mask  Additional Equipment:   Intra-op Plan:   Post-operative Plan:   Informed Consent: I have reviewed the patients History and Physical, chart, labs and discussed the procedure including the risks, benefits and alternatives for the proposed anesthesia with the patient or authorized representative who has indicated his/her understanding and acceptance.     Plan Discussed with: CRNA and Surgeon  Anesthesia Plan Comments:         Anesthesia Quick Evaluation

## 2017-11-22 NOTE — H&P (Signed)
TOTAL KNEE ADMISSION H&P  Patient is being admitted for right total knee arthroplasty.  Subjective:  Chief Complaint:right knee pain.  HPI: Marcus Watson, 57 y.o. male, has a history of pain and functional disability in the right knee due to arthritis and has failed non-surgical conservative treatments for greater than 12 weeks to includeNSAID's and/or analgesics, corticosteriod injections, viscosupplementation injections, flexibility and strengthening excercises and activity modification.  Onset of symptoms was gradual, starting 3 years ago with gradually worsening course since that time. The patient noted prior procedures on the knee to include  arthroscopy on the right knee(s).  Patient currently rates pain in the right knee(s) at 10 out of 10 with activity. Patient has night pain, worsening of pain with activity and weight bearing, pain that interferes with activities of daily living, pain with passive range of motion, crepitus and joint swelling.  Patient has evidence of subchondral sclerosis, periarticular osteophytes and joint space narrowing by imaging studies. There is no active infection.  Patient Active Problem List   Diagnosis Date Noted  . Status post arthroscopy of right knee 07/10/2017  . Unilateral primary osteoarthritis, right knee 07/10/2017  . GERD with stricture 03/19/2017  . Pain in right hip 03/04/2017  . Acute pain of right knee 03/04/2017  . Trochanteric bursitis, right hip 03/04/2017  . Eosinophilic esophagitis 19/37/9024   Past Medical History:  Diagnosis Date  . Allergy   . Eosinophilic esophagitis 0/02/7352  . Erectile dysfunction   . Esophageal laceration 12/04/2016  . Food impaction of esophagus 12/04/2016  . GERD with stricture   . Melanoma ([place])    back  . Mild asthma   . Seasonal allergies   . Testosterone deficiency     Past Surgical History:  Procedure Laterality Date  . [place]OSCOPY  2015   in [place]  . ESOPHAGOGASTRODUODENOSCOPY N/A 12/04/2016    Procedure: ESOPHAGOGASTRODUODENOSCOPY (EGD);  Surgeon: [name], MD;  [facility] ENDOSCOPY;  Service: Endoscopy;  Laterality: N/A;  . HERNIA REPAIR    . KNEE ARTHROCENTESIS    . KNEE ARTHROSCOPY Right   . NASAL SINUS SURGERY    . TONSILLECTOMY      Current Facility-Administered Medications  Medication Dose Route Frequency Provider Last Rate Last Dose  . chlorhexidine (HIBICLENS) 4 % liquid 4 application  60 mL Topical Once Erskine Emery W, PA-C      . clindamycin (CLEOCIN) IVPB 900 mg  900 mg Intravenous On Call to OR Pete Pelt, PA-C      . lactated ringers infusion   Intravenous Continuous Lillia Abed, MD 50 mL/hr at 11/22/17 [phone]    . tranexamic acid (CYKLOKAPRON) 1,000 mg in sodium chloride 0.9 % 100 mL IVPB  1,000 mg Intravenous On Call to [place], [place], MD       Allergies  Allergen Reactions  . Penicillins Anaphylaxis    Has patient had a PCN reaction causing immediate rash, facial/tongue/throat swelling, SOB or lightheadedness with hypotension: No Has patient had a PCN reaction causing severe rash involving mucus membranes or skin necrosis: No Has patient had a PCN reaction that required hospitalization: No Has patient had a PCN reaction occurring within the last 10 years: No If all of the above answers are "NO", then may proceed with Cephalosporin use.  Dub Amis Extracts [Gramineae Pollens]   . Other     Trees, pollen, mildew, cats, dogs     Social History   Tobacco Use  . Smoking status: Never Smoker  . Smokeless  tobacco: Former Systems developer    Types: Snuff  Substance Use Topics  . Alcohol use: Yes    Comment: occassional    Family History  Problem Relation Age of Onset  . Hypertension Other   . Colon cancer Neg Hx   . Esophageal cancer Neg Hx   . Rectal cancer Neg Hx   . Stomach cancer Neg Hx      Review of Systems  Musculoskeletal: Positive for joint pain.  All other systems reviewed and are negative.   Objective:  Physical Exam   Constitutional: He is oriented to person, place, and time. He appears well-developed and well-nourished.  HENT:  Head: Normocephalic and atraumatic.  Eyes: Pupils are equal, round, and reactive to light. EOM are normal.  Neck: Normal range of motion. Neck supple.  Cardiovascular: Normal rate and regular rhythm.  Respiratory: Effort normal and breath sounds normal.  GI: Soft. Bowel sounds are normal.  Musculoskeletal:       Right knee: He exhibits decreased range of motion, swelling, effusion and abnormal alignment. Tenderness found. Medial joint line and lateral joint line tenderness noted.  Neurological: He is alert and oriented to person, place, and time.  Skin: Skin is warm and dry.  Psychiatric: He has a normal mood and affect.    Vital signs in last 24 hours: Temp:  [98.2 F (36.8 C)] 98.2 F (36.8 C) (06/07 0550) Pulse Rate:  [80] 80 (06/07 0550) Resp:  [17] 17 (06/07 0550) BP: (129)/(93) 129/93 (06/07 0550) SpO2:  [96 %] 96 % (06/07 0550) Weight:  [236 lb (107 kg)] 236 lb (107 kg) (06/07 0617)  Labs:   Estimated body mass index is 30.3 kg/m as calculated from the following:   Height as of this encounter: 6\' 2"  (1.88 m).   Weight as of this encounter: 236 lb (107 kg).   Imaging Review Plain radiographs demonstrate severe degenerative joint disease of the right knee(s). The overall alignment ismild varus. The bone quality appears to be excellent for age and reported activity level.   Preoperative templating of the joint replacement has been completed, documented, and submitted to the Operating Room personnel in order to optimize intra-operative equipment management.    Assessment/Plan:  End stage arthritis, right knee   The patient history, physical examination, clinical judgment of the provider and imaging studies are consistent with end stage degenerative joint disease of the right knee(s) and total knee arthroplasty is deemed medically necessary. The treatment  options including medical management, injection therapy arthroscopy and arthroplasty were discussed at length. The risks and benefits of total knee arthroplasty were presented and reviewed. The risks due to aseptic loosening, infection, stiffness, patella tracking problems, thromboembolic complications and other imponderables were discussed. The patient acknowledged the explanation, agreed to proceed with the plan and consent was signed. Patient is being admitted for inpatient treatment for surgery, pain control, PT, OT, prophylactic antibiotics, VTE prophylaxis, progressive ambulation and ADL's and discharge planning. The patient is planning to be discharged home with home health services

## 2017-11-22 NOTE — Evaluation (Signed)
Physical Therapy Evaluation Patient Details Name: Marcus Watson MRN: [id number] DOB: [birth year] Today's Date: 11/22/2017   History of Present Illness  R TKA  Clinical Impression  Pt is s/p TKA resulting in the deficits listed below (see PT Problem List). Pt amb 30' with RW and min assist, expect continued steady progress, pt is hopeful to d/c by Sunday;  Pt will benefit from skilled PT to increase their independence and safety with mobility to allow discharge to the venue listed below.      Follow Up Recommendations Home health PT;Follow surgeon's recommendation for DC plan and follow-up therapies    Equipment Recommendations  Rolling walker with 5" wheels;3in1 (PT)    Recommendations for Other Services       Precautions / Restrictions Precautions Precautions: Knee Required Braces or Orthoses: Knee Immobilizer - Right      Mobility  Bed Mobility Overal bed mobility: Needs Assistance Bed Mobility: Supine to Sit     Supine to sit: Min guard     General bed mobility comments: for safety  Transfers Overall transfer level: Needs assistance Equipment used: Rolling walker (2 wheeled) Transfers: Sit to/from Stand Sit to Stand: Min guard         General transfer comment: cues for hand placement and RLE position  Ambulation/Gait Ambulation/Gait assistance: Min guard Ambulation Distance (Feet): 30 Feet Assistive device: Rolling walker (2 wheeled) Gait Pattern/deviations: Step-to pattern;Decreased weight shift to right;Decreased step length - right;Decreased step length - left     General Gait Details: cues for sequence and use of UEs to control pain with [employer]            Wheelchair Mobility    Modified Rankin (Stroke Patients Only)       Balance                                             Pertinent Vitals/Pain Pain Assessment: 0-10 Pain Score: 10-Worst pain ever Pain Location: R knee Pain Descriptors / Indicators:  Aching;Sore Pain Intervention(s): Monitored during session;Premedicated before session;Repositioned;Ice applied    Home Living Family/patient expects to be discharged to:: Private residence Living Arrangements: Spouse/significant other Available Help at Discharge: Family Type of Home: House Home Access: Stairs to enter   Technical brewer of Steps: 2 Home Layout: One level Home Equipment: None      Prior Function Level of Independence: Independent               Hand Dominance        Extremity/Trunk Assessment   Upper Extremity Assessment Upper Extremity Assessment: Overall WFL for tasks assessed    Lower Extremity Assessment Lower Extremity Assessment: RLE deficits/detail RLE Deficits / Details: ankle WFL,  knee  and hip 2+/5; knee AAROM 15* to 40* flexion        Communication   Communication: No difficulties  Cognition Arousal/Alertness: Awake/alert Behavior During Therapy: WFL for tasks assessed/performed Overall Cognitive Status: Within Functional Limits for tasks assessed                                        General Comments      Exercises Total Joint Exercises Ankle Circles/Pumps: AROM;Both;15 reps Quad Sets: AROM;5 reps;Right   Assessment/Plan    PT Assessment Patient needs continued PT  services  PT Problem List Decreased strength;Decreased range of motion;Decreased mobility;Decreased activity tolerance;Pain;Decreased knowledge of use of DME       PT Treatment Interventions DME instruction;Gait training;Functional mobility training;Therapeutic activities;Therapeutic exercise;Patient/family education;Stair training    PT Goals (Current goals can be found in the Care Plan section)  Acute Rehab PT Goals Patient Stated Goal: less knee pain PT Goal Formulation: With patient Time For Goal Achievement: 12/06/17 Potential to Achieve Goals: Good    Frequency 7X/week   Barriers to discharge        Co-evaluation                AM-PAC PT "6 Clicks" Daily Activity  Outcome Measure Difficulty turning over in bed (including adjusting bedclothes, sheets and blankets)?: A Little Difficulty moving from lying on back to sitting on the side of the bed? : A Little Difficulty sitting down on and standing up from a chair with arms (e.g., wheelchair, bedside commode, etc,.)?: Unable Help needed moving to and from a bed to chair (including a wheelchair)?: A Little Help needed walking in hospital room?: A Little Help needed climbing 3-5 steps with a railing? : A Lot 6 Click Score: 15    End of Session Equipment Utilized During Treatment: Gait belt;Right knee immobilizer Activity Tolerance: Patient tolerated treatment well Patient left: in chair;with call bell/phone within reach;with chair alarm set   PT Visit Diagnosis: Difficulty in walking, not elsewhere classified (R26.2)    Time: 5520-8022 PT Time Calculation (min) (ACUTE ONLY): 24 min   Charges:   PT Evaluation $PT Eval Low Complexity: 1 Low PT Treatments $Gait Training: 8-22 mins   PT G CodesKenyon Ana, PT Pager: [phone] 11/22/2017   [facility] 11/22/2017, 5:55 PM

## 2017-11-22 NOTE — Anesthesia Postprocedure Evaluation (Signed)
Anesthesia Post Note  Patient: Hardin Hardenbrook  Procedure(s) Performed: RIGHT TOTAL KNEE ARTHROPLASTY (Right Knee)     Patient location during evaluation: PACU Anesthesia Type: Spinal Level of consciousness: oriented and awake and alert Pain management: pain level controlled Vital Signs Assessment: post-procedure vital signs reviewed and stable Respiratory status: spontaneous breathing, respiratory function stable and patient connected to nasal cannula oxygen Cardiovascular status: blood pressure returned to baseline and stable Postop Assessment: no headache, no backache and no apparent nausea or vomiting Anesthetic complications: no    Last Vitals:  Vitals:   11/22/17 1324 11/22/17 1451  BP: 135/81 127/74  Pulse: 69 70  Resp: 15 16  Temp: 36.7 C (!) 36.4 C  SpO2: 99% 98%    Last Pain:  Vitals:   11/22/17 1654  TempSrc:   PainSc: 6                  Siyana Erney DAVID

## 2017-11-22 NOTE — Anesthesia Procedure Notes (Signed)
Anesthesia Regional Block: Adductor canal block   Pre-Anesthetic Checklist: ,, timeout performed, Correct Patient, Correct Site, Correct Laterality, Correct Procedure, Correct Position, site marked, Risks and benefits discussed,  Surgical consent,  Pre-op evaluation,  At surgeon's request and post-op pain management  Laterality: Right  Prep: chloraprep       Needles:  Injection technique: Single-shot  Needle Type: Echogenic Stimulator Needle     Needle Length: 9cm  Needle Gauge: 21     Additional Needles:   Narrative:  Start time: 11/22/2017 7:01 AM End time: 11/22/2017 7:11 AM Injection made incrementally with aspirations every 5 mL.  Performed by: Personally  Anesthesiologist: Lillia Abed, MD  Additional Notes: Monitors applied. Patient sedated. Sterile prep and drape,hand hygiene and sterile gloves were used. Relevant anatomy identified.Needle position confirmed.Local anesthetic injected incrementally after negative aspiration. Local anesthetic spread visualized around nerve(s). Vascular puncture avoided. No complications. Image printed for medical record.The patient tolerated the procedure well.    Lillia Abed MD

## 2017-11-23 LAB — BASIC METABOLIC PANEL
Anion gap: 6 (ref 5–15)
BUN: 15 mg/dL (ref 6–20)
CO2: 28 mmol/L (ref 22–32)
Calcium: 8.4 mg/dL — ABNORMAL LOW (ref 8.9–10.3)
Chloride: 101 mmol/L (ref 101–111)
Creatinine, Ser: 1.06 mg/dL (ref 0.61–1.24)
GFR calc Af Amer: >60 mL/min (ref >60)
GFR calc non Af Amer: >60 mL/min (ref >60)
Glucose, Bld: 135 mg/dL — ABNORMAL HIGH (ref 65–99)
Potassium: 3.8 mmol/L (ref 3.5–5.1)
Sodium: 135 mmol/L (ref 135–145)

## 2017-11-23 LAB — CBC
HCT: 39.9 % (ref 39.0–52.0)
Hemoglobin: 13.4 g/dL (ref 13.0–17.0)
MCH: 31 pg (ref 26.0–34.0)
MCHC: 33.6 g/dL (ref 30.0–36.0)
MCV: 92.4 fL (ref 78.0–100.0)
Platelets: 264 10*3/uL (ref 150–400)
RBC: 4.32 MIL/uL (ref 4.22–5.81)
RDW: 13 % (ref 11.5–15.5)
WBC: 16.7 10*3/uL — ABNORMAL HIGH (ref 4.0–10.5)

## 2017-11-23 MED ORDER — METHOCARBAMOL 500 MG PO TABS: 500.0000 mg | ORAL_TABLET | Freq: Four times a day (QID) | ORAL | 0 refills | Status: DC | PRN

## 2017-11-23 MED ORDER — ASPIRIN 81 MG PO CHEW: 81.0000 mg | CHEWABLE_TABLET | Freq: Two times a day (BID) | ORAL | 0 refills | Status: DC

## 2017-11-23 MED ORDER — OXYCODONE HCL 5 MG PO TABS: 5.0000 mg | ORAL_TABLET | ORAL | 0 refills | Status: DC | PRN

## 2017-11-23 NOTE — Progress Notes (Signed)
Subjective: 1 Day Post-Op Procedure(s) (LRB): RIGHT TOTAL KNEE ARTHROPLASTY (Right) Patient reports pain as moderate.    Objective: Vital signs in last 24 hours: Temp:  [97.5 F (36.4 C)-99.5 F (37.5 C)] 99 F (37.2 C) (06/08 0928) Pulse Rate:  [66-85] 83 (06/08 0928) Resp:  [14-16] 16 (06/08 0928) BP: (126-138)/(70-85) 134/85 (06/08 0928) SpO2:  [97 %-100 %] 97 % (06/08 0424)  Intake/Output from previous day: 06/07 0701 - 06/08 0700 In: 4056.3 [P.O.:480; I.V.:3526.3; IV Piggyback:50] Out: 3200 [Urine:3025; Blood:175] Intake/Output this shift: Total I/O In: -  Out: 300 [Urine:300]  Recent Labs    11/23/17 0547  HGB 13.4   Recent Labs    11/23/17 0547  WBC 16.7*  RBC 4.32  HCT 39.9  PLT 264   Recent Labs    11/23/17 0547  NA 135  K 3.8  CL 101  CO2 28  BUN 15  CREATININE 1.06  GLUCOSE 135*  CALCIUM 8.4*   No results for input(s): LABPT, INR in the last 72 hours.  Sensation intact distally Intact pulses distally Dorsiflexion/Plantar flexion intact Incision: dressing C/D/I No cellulitis present Compartment soft  Assessment/Plan: 1 Day Post-Op Procedure(s) (LRB): RIGHT TOTAL KNEE ARTHROPLASTY (Right) Up with therapy Plan for discharge tomorrow Discharge home with home health    Mcarthur Rossetti 11/23/2017, 11:55 AM

## 2017-11-23 NOTE — Evaluation (Signed)
Occupational Therapy Evaluation Patient Details Name: Marcus Watson MRN: [id number] DOB: 08-Feb-1961 Today's Date: 11/23/2017    History of Present Illness R TKA   Clinical Impression   Pt educated on safety with functional transfers to 3in1 and safe walker use. Pt will benefit from continued Ot to progress ADL independence.    Follow Up Recommendations  No OT follow up;Supervision/Assistance - 24 hour    Equipment Recommendations  3 in 1 bedside commode    Recommendations for Other Services       Precautions / Restrictions Precautions Precautions: Knee Required Braces or Orthoses: Knee Immobilizer - Right Restrictions Weight Bearing Restrictions: No Other Position/Activity Restrictions: WBAT       Mobility Bed Mobility Overal bed mobility: Needs Assistance Bed Mobility: Supine to Sit     Supine to sit: Min assist    General bed mobility comments: min assist for R LE over to EOB.  Transfers Overall transfer level: Needs assistance Equipment used: Rolling walker (2 wheeled) Transfers: Sit to/from Stand Sit to Stand: Min assist         General transfer comment: min cues for safety and hand placement and LE management.     Balance Overall balance assessment: No apparent balance deficits (not formally assessed)                                         ADL either performed or assessed with clinical judgement   ADL Overall ADL's : Needs assistance/impaired Eating/Feeding: Independent;Sitting   Grooming: Wash/dry hands;Set up;Sitting   Upper Body Bathing: Set up;Sitting   Lower Body Bathing: Moderate assistance;Sit to/from stand   Upper Body Dressing : Set up;Sitting   Lower Body Dressing: Moderate assistance;Sit to/from stand   Toilet Transfer: Minimal assistance;Ambulation;RW;BSC   Toileting- Clothing Manipulation and Hygiene: Minimal assistance;Sit to/from stand         General ADL Comments: Pt requires min verbal cues for  safety with walker including sequence and distance to self. Also cues for safety in the bathroom with tighter spaces and safely turning walker. Educated on AE options but pt states wife can assist with LB dressing. Pt has a fully walk in shower and no ledge to negotiate. Educated on KI use and how to don/doff and when to wear.      Vision Patient Visual Report: No change from baseline       Perception     Praxis      Pertinent Vitals/Pain Pain Assessment: 0-10 Pain Score: 6  Pain Location: R knee Pain Descriptors / Indicators: Aching Pain Intervention(s): Monitored during session;Ice applied     Hand Dominance     Extremity/Trunk Assessment Upper Extremity Assessment Upper Extremity Assessment: Overall WFL for tasks assessed           Communication Communication Communication: No difficulties   Cognition Arousal/Alertness: Awake/alert Behavior During Therapy: WFL for tasks assessed/performed Overall Cognitive Status: Within Functional Limits for tasks assessed                                     General Comments       Exercises     Shoulder Instructions      Home Living Family/patient expects to be discharged to:: Private residence Living Arrangements: Spouse/significant other Available Help at Discharge: Family Type of Home: [place]  Access: Stairs to enter [employer] of Steps: 2   Home Layout: One level     Bathroom Shower/Tub: Occupational psychologist: Standard     Home Equipment: None   Additional Comments: fully walk in shower. no ledge      Prior Functioning/Environment Level of Independence: Independent                 OT Problem List: Decreased strength;Decreased knowledge of use of DME or AE      OT Treatment/Interventions: Self-care/ADL training;DME and/or AE instruction;Therapeutic activities;Patient/family education    OT Goals(Current goals can be found in the care plan section) Acute  Rehab OT Goals Patient Stated Goal: pain to improve OT Goal Formulation: With patient/family Time For Goal Achievement: 11/30/17 Potential to Achieve Goals: Good  OT Frequency: Min 2X/week   Barriers to D/C:            Co-evaluation              AM-PAC PT "6 Clicks" Daily Activity     Outcome Measure Help from another person eating meals?: None Help from another person taking care of personal grooming?: None Help from another person toileting, which includes using toliet, bedpan, or urinal?: A Little Help from another person bathing (including washing, rinsing, drying)?: A Little Help from another person to put on and taking off regular upper body clothing?: None Help from another person to put on and taking off regular lower body clothing?: A Lot 6 Click Score: 20   End of Session Equipment Utilized During Treatment: Gait belt;Rolling walker;Right knee immobilizer CPM Right Knee CPM Right Knee: On  Activity Tolerance: Patient tolerated treatment well Patient left: in bed;with call bell/phone within reach  OT Visit Diagnosis: Unsteadiness on feet (R26.81);Muscle weakness (generalized) (M62.81)                Time: 2841-3244 OT Time Calculation (min): 26 min Charges:  OT General Charges $OT Visit: 1 Visit OT Evaluation $OT Eval Low Complexity: 1 Low OT Treatments $Therapeutic Activity: 8-22 mins G-Codes:       Jae Dire Masiya Claassen 11/23/2017, 4:24 PM

## 2017-11-23 NOTE — Progress Notes (Signed)
Pt states he will not need his nasal spray while here - takes it for allergy to family pet. Gearld Kerstein, [employer]

## 2017-11-23 NOTE — Progress Notes (Signed)
Physical Therapy Treatment Patient Details Name: Marcus Watson MRN: [id number] DOB: 1960-11-07 Today's Date: 11/23/2017    History of Present Illness R TKA    PT Comments    Patient received up in chair, pleasant and willing to participate in skilled PT session but very pain limited today and with poor safety awareness due to focusing on pain. Of note, on PT arrival to room surgical LE was very swollen and painful as compared to AM session; asked nursing staff to assess and clear for PT due to concern over acute edema and pain, nursing staff reports patient likely OK to participate in PT this afternoon.  Continued working on Neurosurgeon, also introduced stair training today with MinA due to pain levels and with wife stabilizing RW. He was left in bed with bed alarm activated, all needs otherwise met and questions/concerns addressed today.    Follow Up Recommendations  Home health PT;Follow surgeon's recommendation for DC plan and follow-up therapies     Equipment Recommendations  Rolling walker with 5" wheels;3in1 (PT)    Recommendations for Other Services       Precautions / Restrictions Precautions Precautions: Knee Required Braces or Orthoses: Knee Immobilizer - Right Restrictions Weight Bearing Restrictions: No Other Position/Activity Restrictions: WBAT     Mobility  Bed Mobility Overal bed mobility: Needs Assistance Bed Mobility: Sit to Supine     Supine to sit: Min guard;Min assist Sit to supine: Min assist   General bed mobility comments: MinA for LE management for back to bed   Transfers Overall transfer level: Needs assistance Equipment used: Rolling walker (2 wheeled) Transfers: Sit to/from Stand Sit to Stand: Min guard         General transfer comment: cues for safety and technique, patient very focused on pain and with poor safety awareness this afternoon   Ambulation/Gait Ambulation/Gait assistance: Min guard Ambulation Distance  (Feet): 90 Feet Assistive device: Rolling walker (2 wheeled) Gait Pattern/deviations: Step-to pattern;Decreased weight shift to right;Decreased step length - right;Decreased step length - left     General Gait Details: continued with gait training, cues provided for heel toe and improved step lengths, fluidity of gait    Stairs Stairs: Yes Stairs assistance: Min guard;Min assist Stair Management: No rails Number of Stairs: 2 General stair comments: patient has no railings at home- backwards ascent with RW, forwards descent with RW, wife trained on how to stabilize walker for safe mobility; very pain limited    Wheelchair Mobility    Modified Rankin (Stroke Patients Only)       Balance Overall balance assessment: No apparent balance deficits (not formally assessed)                                          Cognition Arousal/Alertness: Awake/alert Behavior During Therapy: WFL for tasks assessed/performed Overall Cognitive Status: Within Functional Limits for tasks assessed                                        Exercises Total Joint Exercises Goniometric ROM: R knee AROM 16 degrees extension to 50 degrees flexion     General Comments        Pertinent Vitals/Pain Pain Assessment: 0-10 Pain Score: 6  Pain Location: R knee Pain Descriptors / Indicators: Aching;Sore Pain Intervention(s):  Limited activity within patient's tolerance;Monitored during session;Ice applied;Premedicated before session    Home Living                      Prior Function            PT Goals (current goals can now be found in the care plan section) Acute Rehab PT Goals Patient Stated Goal: less knee pain PT Goal Formulation: With patient Time For Goal Achievement: 12/06/17 Potential to Achieve Goals: Good Progress towards PT goals: Progressing toward goals    Frequency    7X/week      PT Plan Current plan remains appropriate     Co-evaluation              AM-PAC PT "6 Clicks" Daily Activity  Outcome Measure  Difficulty turning over in bed (including adjusting bedclothes, sheets and blankets)?: A Little Difficulty moving from lying on back to sitting on the side of the bed? : A Little Difficulty sitting down on and standing up from a chair with arms (e.g., wheelchair, bedside commode, etc,.)?: A Little Help needed moving to and from a bed to chair (including a wheelchair)?: A Little Help needed walking in hospital room?: A Little Help needed climbing 3-5 steps with a railing? : A Lot 6 Click Score: 17    End of Session Equipment Utilized During Treatment: Gait belt Activity Tolerance: Patient tolerated treatment well Patient left: in bed;with bed alarm set;with call bell/phone within reach;with family/visitor present   PT Visit Diagnosis: Difficulty in walking, not elsewhere classified (R26.2)     Time: 2993-7169 PT Time Calculation (min) (ACUTE ONLY): 28 min  Charges:  $Gait Training: 23-37 mins                    G Codes:       Deniece Ree PT, DPT, CBIS  Supplemental Physical Therapist [place]   Pager [phone]

## 2017-11-23 NOTE — Discharge Instructions (Signed)

## 2017-11-23 NOTE — Progress Notes (Signed)
Physical Therapy Treatment Patient Details Name: Marcus Watson MRN: [id number] DOB: 1961-05-21 Today's Date: 11/23/2017    History of Present Illness R TKA    PT Comments    Patient received in bed, reporting pain about 5/10 today, but willing to participate in skilled PT services this morning. R knee AROM approximately 16 degrees extension to 50 degrees flexion this morning. He requires MinA for R LE management during functional bed mobility today, however was otherwise able to complete functional transfers and gait approximately 37f with RW and min guard. He was left up in the chair with all needs met, chair alarm active, all questions/concerns addressed this morning and wife present.     Follow Up Recommendations  Home health PT;Follow surgeon's recommendation for DC plan and follow-up therapies     Equipment Recommendations  Rolling walker with 5" wheels;3in1 (PT)    Recommendations for Other Services       Precautions / Restrictions Precautions Precautions: Knee Required Braces or Orthoses: Knee Immobilizer - Right Restrictions Weight Bearing Restrictions: No Other Position/Activity Restrictions: WBAT     Mobility  Bed Mobility Overal bed mobility: Needs Assistance Bed Mobility: Supine to Sit     Supine to sit: Min guard;Min assist     General bed mobility comments: patient requiring Min guard-MinA for safety and also due to pain with motion today   Transfers Overall transfer level: Needs assistance Equipment used: Rolling walker (2 wheeled) Transfers: Sit to/from Stand Sit to Stand: Min guard         General transfer comment: cues for safety and technique  Ambulation/Gait Ambulation/Gait assistance: Min guard Ambulation Distance (Feet): 90 Feet Assistive device: Rolling walker (2 wheeled) Gait Pattern/deviations: Step-to pattern;Decreased weight shift to right;Decreased step length - right;Decreased step length - left     General Gait Details: able  to progress gait distance today, cues provided for upright posture and heel-toe mechanics    Stairs             Wheelchair Mobility    Modified Rankin (Stroke Patients Only)       Balance Overall balance assessment: No apparent balance deficits (not formally assessed)                                          Cognition Arousal/Alertness: Awake/alert Behavior During Therapy: WFL for tasks assessed/performed Overall Cognitive Status: Within Functional Limits for tasks assessed                                        Exercises Total Joint Exercises Goniometric ROM: R knee AROM 16 degrees extension to 50 degrees flexion     General Comments        Pertinent Vitals/Pain Pain Assessment: 0-10 Pain Score: 5  Pain Location: R knee Pain Descriptors / Indicators: Aching;Sore Pain Intervention(s): Limited activity within patient's tolerance;Monitored during session;Premedicated before session;Repositioned    Home Living                      Prior Function            PT Goals (current goals can now be found in the care plan section) Acute Rehab PT Goals Patient Stated Goal: less knee pain PT Goal Formulation: With patient Time For Goal Achievement: 12/06/17 Potential  to Achieve Goals: Good Progress towards PT goals: Progressing toward goals    Frequency    7X/week      PT Plan Current plan remains appropriate    Co-evaluation              AM-PAC PT "6 Clicks" Daily Activity  Outcome Measure  Difficulty turning over in bed (including adjusting bedclothes, sheets and blankets)?: A Little Difficulty moving from lying on back to sitting on the side of the bed? : A Little Difficulty sitting down on and standing up from a chair with arms (e.g., wheelchair, bedside commode, etc,.)?: A Little Help needed moving to and from a bed to chair (including a wheelchair)?: A Little Help needed walking in hospital room?: A  Little Help needed climbing 3-5 steps with a railing? : A Lot 6 Click Score: 17    End of Session Equipment Utilized During Treatment: Gait belt Activity Tolerance: Patient tolerated treatment well Patient left: in chair;with call bell/phone within reach;with chair alarm set   PT Visit Diagnosis: Difficulty in walking, not elsewhere classified (R26.2)     Time: 0354-6568 PT Time Calculation (min) (ACUTE ONLY): 18 min  Charges:  $Gait Training: 8-22 mins                    G Codes:       Deniece Ree PT, DPT, CBIS  Supplemental Physical Therapist [place]   Pager [phone]

## 2017-11-24 NOTE — Discharge Summary (Signed)
Patient ID: Marcus Watson MRN: [id number] DOB/AGE: Nov 11, 1960 57 y.o.  Admit date: 11/22/2017 Discharge date: 11/24/2017  Admission Diagnoses:  Principal Problem:   Unilateral primary osteoarthritis, right knee Active Problems:   Status post revision of total knee, right   Discharge Diagnoses:  Same  Past Medical History:  Diagnosis Date  . Allergy   . Eosinophilic esophagitis 11/18/9526  . Erectile dysfunction   . Esophageal laceration 12/04/2016  . Food impaction of esophagus 12/04/2016  . GERD with stricture   . Melanoma ([place])    back  . Mild asthma   . Seasonal allergies   . Testosterone deficiency     Surgeries: Procedure(s): RIGHT TOTAL KNEE ARTHROPLASTY on 11/22/2017   Consultants:   Discharged Condition: Improved  Hospital Course: Marcus Watson is an 57 y.o. male who was admitted 11/22/2017 for operative treatment ofUnilateral primary osteoarthritis, right knee. Patient has severe unremitting pain that affects sleep, daily activities, and work/hobbies. After pre-op clearance the patient was taken to the operating room on 11/22/2017 and underwent  Procedure(s): RIGHT TOTAL KNEE ARTHROPLASTY.    Patient was given perioperative antibiotics:  Anti-infectives (From admission, onward)   Start     Dose/Rate Route Frequency Ordered Stop   11/22/17 1400  clindamycin (CLEOCIN) IVPB 600 mg     600 mg 100 mL/hr over 30 Minutes Intravenous Every 6 hours 11/22/17 1138 11/22/17 2319   11/22/17 0600  clindamycin (CLEOCIN) IVPB 900 mg     900 mg 100 mL/hr over 30 Minutes Intravenous On call to O.R. 11/22/17 0542 11/22/17 4132       Patient was given sequential compression devices, early ambulation, and chemoprophylaxis to prevent DVT.  Patient benefited maximally from hospital stay and there were no complications.    Recent vital signs:  Patient Vitals for the past 24 hrs:  BP Temp Temp src Pulse Resp SpO2  11/24/17 0544 (!) 147/75 99.2 F (37.3 C) Oral 94 16 92 %   11/23/17 2149 (!) 161/80 98.9 F (37.2 C) Oral 86 16 95 %  11/23/17 1347 (!) 156/85 98.6 F (37 C) Oral 82 - 98 %  11/23/17 0928 134/85 99 F (37.2 C) Oral 83 16 -     Recent laboratory studies:  Recent Labs    11/23/17 0547  WBC 16.7*  HGB 13.4  HCT 39.9  PLT 264  NA 135  K 3.8  CL 101  CO2 28  BUN 15  CREATININE 1.06  GLUCOSE 135*  CALCIUM 8.4*     Discharge Medications:   Allergies as of 11/24/2017      Reactions   Penicillins Anaphylaxis   Has patient had a PCN reaction causing immediate rash, facial/tongue/throat swelling, SOB or lightheadedness with hypotension: No Has patient had a PCN reaction causing severe rash involving mucus membranes or skin necrosis: No Has patient had a PCN reaction that required hospitalization: No Has patient had a PCN reaction occurring within the last 10 years: No If all of the above answers are "NO", then may proceed with Cephalosporin use.   Grass Extracts [gramineae Pollens]    Other    Trees, pollen, mildew, cats, dogs       Medication List    STOP taking these medications   Glucosamine 750 MG Tabs     TAKE these medications   ADVAIR DISKUS 100-50 MCG/DOSE Aepb Generic drug:  Fluticasone-Salmeterol Inhale 1 puff into the lungs at bedtime as needed.   albuterol 108 (90 Base) MCG/ACT inhaler Commonly known as:  PROVENTIL  HFA;VENTOLIN HFA Inhale 1 puff into the lungs as needed for wheezing.   aspirin 81 MG chewable tablet Chew 1 tablet (81 mg total) by mouth 2 (two) times daily.   cetirizine 10 MG tablet Commonly known as:  ZYRTEC Take 10 mg by mouth at bedtime.   CLEAR EYES SEASONAL RELIEF 0.012-0.2-0.25 % Soln Generic drug:  Naphazoline-Glycerin-Zinc Sulf Apply 1 drop to eye 2 (two) times daily as needed.   DEPO-TESTOSTERONE 200 MG/ML injection Generic drug:  testosterone cypionate Inject 200 mg into the muscle See admin instructions. Every two weeks   DYMISTA 137-50 MCG/ACT Susp Generic drug:   Azelastine-Fluticasone Place 1 spray into both nostrils every morning.   methocarbamol 500 MG tablet Commonly known as:  ROBAXIN Take 1 tablet (500 mg total) by mouth every 6 (six) hours as needed for muscle spasms.   oxyCODONE 5 MG immediate release tablet Commonly known as:  Oxy IR/ROXICODONE Take 1-2 tablets (5-10 mg total) by mouth every 4 (four) hours as needed for moderate pain (pain score 4-6).   pantoprazole 40 MG tablet Commonly known as:  PROTONIX Take 40 mg by mouth 2 (two) times daily.   sildenafil 20 MG tablet Commonly known as:  REVATIO Take 20 mg by mouth daily as needed.   VITAMIN D-1000 MAX ST 1000 units tablet Generic drug:  Cholecalciferol Take 1,000 Units by mouth daily.            Durable Medical Equipment  (From admission, onward)        Start     Ordered   11/22/17 1139  DME 3 n 1  Once     11/22/17 1138   11/22/17 1139  DME Walker rolling  Once    Question:  Patient needs a walker to treat with the following condition  Answer:  Status post revision of total knee, right   11/22/17 1138      Diagnostic Studies: Dg Knee Right Port  Result Date: 11/22/2017 CLINICAL DATA:  Right total knee arthroplasty EXAM: PORTABLE RIGHT KNEE - 1-2 VIEW COMPARISON:  None. FINDINGS: Status post right total knee arthroplasty with well-positioned right distal femoral, right proximal tibial and posterior patellar prostheses with no evidence of hardware fracture or loosening. No acute osseous fracture. No dislocation. No suspicious focal osseous lesions. Tiny superior right patellar enthesophyte. No significant joint effusion. Expected soft tissue gas surrounding the right knee joint anteriorly. IMPRESSION: Satisfactory immediate postoperative appearance status post right total knee arthroplasty. Electronically Signed   By: Ilona Sorrel M.D.   On: 11/22/2017 10:12    Disposition: Discharge disposition: 01-Home or [place]     Mcarthur Rossetti, MD Follow up in 2 week(s).   Specialty:  Orthopedic Surgery Contact information: [place] [address] [phone]            Signed: Aundra Dubin 11/24/2017, 8:51 AM

## 2017-11-24 NOTE — Progress Notes (Signed)
Discharged from floor via w/c for transport home by car. Belongings & wife with pt. No changes in assessment. Dodie Parisi  

## 2017-11-24 NOTE — Progress Notes (Signed)
Subjective: 2 Days Post-Op Procedure(s) (LRB): RIGHT TOTAL KNEE ARTHROPLASTY (Right) Patient reports pain as moderate.  Doing well this am.    Objective: Vital signs in last 24 hours: Temp:  [98.6 F (37 C)-99.2 F (37.3 C)] 99.2 F (37.3 C) (06/09 0544) Pulse Rate:  [82-94] 94 (06/09 0544) Resp:  [16] 16 (06/09 0544) BP: (134-161)/(75-85) 147/75 (06/09 0544) SpO2:  [92 %-98 %] 92 % (06/09 0544)  Intake/Output from previous day: 06/08 0701 - 06/09 0700 In: 480 [P.O.:480] Out: 3100 [Urine:3100] Intake/Output this shift: Total I/O In: 120 [P.O.:120] Out: 400 [Urine:400]  Recent Labs    11/23/17 0547  HGB 13.4   Recent Labs    11/23/17 0547  WBC 16.7*  RBC 4.32  HCT 39.9  PLT 264   Recent Labs    11/23/17 0547  NA 135  K 3.8  CL 101  CO2 28  BUN 15  CREATININE 1.06  GLUCOSE 135*  CALCIUM 8.4*   No results for input(s): LABPT, INR in the last 72 hours.  Neurologically intact Neurovascular intact Sensation intact distally Intact pulses distally Dorsiflexion/Plantar flexion intact Incision: scant drainage No cellulitis present Compartment soft  Anticipated LOS equal to or greater than 2 midnights due to - Age 57 and older with one or more of the following:  - Obesity  - Expected need for hospital services (PT, OT, Nursing) required for safe  discharge  - Anticipated need for postoperative skilled nursing care or inpatient rehab  - Active co-morbidities: None OR   - Unanticipated findings during/Post Surgery: None  - Patient is a high risk of re-admission due to: None   Assessment/Plan: 2 Days Post-Op Procedure(s) (LRB): RIGHT TOTAL KNEE ARTHROPLASTY (Right) Advance diet Up with therapy D/C IV fluids Discharge home with home health this am WBAT RLE    Aundra Dubin 11/24/2017, 8:58 AM

## 2017-11-24 NOTE — Care Management Note (Signed)
Case Management Note  Patient Details  Name: Marcus Watson MRN: [id number] Date of Birth: [birth year]  Subjective/Objective:    Right TKA                Action/Plan: Spoke to pt and offered choice for HH/list provided. Pt agreeable to [facility]. Contacted [facility] for RW and 3n1 bedside commode. Delivered to room by [facility].   Expected Discharge Date:  11/24/17               Expected Discharge Plan:  [place]  In-House Referral:  NA  Discharge planning Services  CM Consult  Post Acute Care Choice:  Home Health Choice offered to:  Patient  DME Arranged:  3-N-1, Walker rolling DME Agency:  [place]:  PT [place] Agency:  [facility] (formerly [facility])  Status of Service:  Completed, signed off  If discussed at [employer], dates discussed:    Additional Comments:  Erenest Rasher, RN 11/24/2017, 9:57 AM

## 2017-11-24 NOTE — Progress Notes (Signed)
Occupational Therapy Treatment Patient Details Name: Marcus Watson MRN: [id number] DOB: 04-20-1961 Today's Date: 11/24/2017    History of present illness 57 yo male s/p R TKA PMH:   OT comments  All education is complete and patient indicates understanding. No acute needs at this time. All goals med and adequate level for d/c home.    Follow Up Recommendations  No OT follow up;Supervision/Assistance - 24 hour    Equipment Recommendations  3 in 1 bedside commode    Recommendations for Other Services      Precautions / Restrictions Precautions Precautions: Knee Required Braces or Orthoses: Knee Immobilizer - Right Knee Immobilizer - Right: Discontinue once straight leg raise with < 10 degree lag Restrictions Weight Bearing Restrictions: No Other Position/Activity Restrictions: wba       Mobility Bed Mobility Overal bed mobility: Needs Assistance Bed Mobility: Supine to Sit     Supine to sit: Supervision     General bed mobility comments: oob in chair on arrival  Transfers Overall transfer level: Needs assistance Equipment used: Rolling walker (2 wheeled) Transfers: Sit to/from Stand Sit to Stand: Supervision         General transfer comment: educated on the use of a grocery bag on the RW to help with transfering needed items and not holding with RW. pt educated on environment setup to decr need to have (A) with items    Balance Overall balance assessment: No apparent balance deficits (not formally assessed)                                         ADL either performed or assessed with clinical judgement   ADL Overall ADL's : Needs assistance/impaired Eating/Feeding: Independent                   Lower Body Dressing: Min guard;Sit to/from stand Lower Body Dressing Details (indicate cue type and reason): pt is able to reach toes on operative leg. Pt will need to use bil UE to help lift R LE. pt reports planning to have wife (A). 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 educated on the use of wet wipes      Functional mobility during ADLs: Supervision/safety;Rolling walker General ADL Comments: pt completed shower transfer and educated on LB dressing R LE first and then L LE. pt requires KI during transfer this session.     Vision       Perception     Praxis      Cognition Arousal/Alertness: Awake/alert Behavior During Therapy: WFL for tasks assessed/performed Overall Cognitive Status: Within Functional Limits for tasks assessed                                          Exercises Exercises: Total Joint Total Joint Exercises Ankle Circles/Pumps: AROM;Both;15 reps Quad Sets: AROM;Right;15 reps;Supine Heel Slides: AAROM;Right;20 reps;Supine Straight Leg Raises: AAROM;Right;20 reps;Supine Goniometric ROM: R knee AAROM -10 - 50   Shoulder Instructions       General Comments  Pertinent Vitals/ Pain       Pain Assessment: 0-10 Pain Score: 6  Pain Location: R knee Pain Descriptors / Indicators: Aching;Sore Pain Intervention(s): Monitored during session;Premedicated before session;Repositioned;Other (comment);Ice applied(KI used)  Home Living                                          Prior Functioning/Environment              Frequency  Min 2X/week        Progress Toward Goals  OT Goals(current goals can now be found in the care plan section)  Progress towards OT goals: Progressing toward goals;Goals met/education completed, patient discharged from OT  Acute Rehab OT Goals Patient Stated Goal: pain to  improve OT Goal Formulation: With patient/family Time For Goal Achievement: 11/30/17 Potential to Achieve Goals: Good ADL Goals Pt Will Perform Lower Body Dressing: with caregiver independent in assisting;sit to/from stand(met) Pt Will Transfer to Toilet: with supervision;ambulating;bedside commode(met) Pt Will Perform Toileting - Clothing Manipulation and hygiene: with supervision;sit to/from stand(met)  Plan Discharge plan remains appropriate    Co-evaluation                 AM-PAC PT "6 Clicks" Daily Activity     Outcome Measure   Help from another person eating meals?: None Help from another person taking care of personal grooming?: None Help from another person toileting, which includes using toliet, bedpan, or urinal?: None Help from another person bathing (including washing, rinsing, drying)?: None Help from another person to put on and taking off regular upper body clothing?: None Help from another person to put on and taking off regular lower body clothing?: A Little 6 Click Score: 23    End of Session Equipment Utilized During Treatment: Gait belt;Rolling walker;Right knee immobilizer CPM Right Knee CPM Right Knee: Off  OT Visit Diagnosis: Unsteadiness on feet (R26.81);Muscle weakness (generalized) (M62.81)   Activity Tolerance Patient tolerated treatment well   Patient Left in chair;with call bell/phone within reach   Nurse Communication Mobility status;Precautions        Time: 8250-0370 OT Time Calculation (min): 16 min  Charges: OT General Charges $OT Visit: 1 Visit OT Treatments $Self Care/Home Management : 8-22 mins   Jeri Modena   OTR/L Pager: [phone] Office: [phone] .    Parke Poisson B 11/24/2017, 11:05 AM

## 2017-11-24 NOTE — Progress Notes (Signed)
Physical Therapy Treatment Patient Details Name: Marcus Watson MRN: [id number] DOB: 1961-03-11 Today's Date: 11/24/2017    History of Present Illness R TKA    PT Comments    Pt progressing well with mobility and reports much better pain control this am.  Pt reviewed therex, stairs and don/doff KI.   Follow Up Recommendations  Home health PT;Follow surgeon's recommendation for DC plan and follow-up therapies     Equipment Recommendations  Rolling walker with 5" wheels;3in1 (PT)    Recommendations for Other Services       Precautions / Restrictions Precautions Precautions: Knee Required Braces or Orthoses: Knee Immobilizer - Right Knee Immobilizer - Right: Discontinue once straight leg raise with < 10 degree lag Restrictions Weight Bearing Restrictions: No Other Position/Activity Restrictions: WBAT     Mobility  Bed Mobility Overal bed mobility: Needs Assistance Bed Mobility: Supine to Sit     Supine to sit: Supervision     General bed mobility comments: Pt self assisting R LE with UEs  Transfers Overall transfer level: Needs assistance Equipment used: Rolling walker (2 wheeled) Transfers: Sit to/from Stand Sit to Stand: Supervision         General transfer comment: min cues for safety and hand placement and LE management.   Ambulation/Gait Ambulation/Gait assistance: Min guard;Supervision Ambulation Distance (Feet): 120 Feet Assistive device: Rolling walker (2 wheeled) Gait Pattern/deviations: Step-to pattern;Decreased weight shift to right;Decreased step length - right;Decreased step length - left Gait velocity: decr   General Gait Details: min cues for posture and position from RW   Stairs Stairs: Yes Stairs assistance: Min guard;Min assist Stair Management: No rails;Step to pattern;Backwards;With walker Number of Stairs: 2 General stair comments: min cues for sequence   Wheelchair Mobility    Modified Rankin (Stroke Patients Only)       Balance Overall balance assessment: No apparent balance deficits (not formally assessed)                                          Cognition Arousal/Alertness: Awake/alert Behavior During Therapy: WFL for tasks assessed/performed Overall Cognitive Status: Within Functional Limits for tasks assessed                                        Exercises Total Joint Exercises Ankle Circles/Pumps: AROM;Both;15 reps Quad Sets: AROM;Right;15 reps;Supine Heel Slides: AAROM;Right;20 reps;Supine Straight Leg Raises: AAROM;Right;20 reps;Supine Goniometric ROM: R knee AAROM -10 - 50    General Comments        Pertinent Vitals/Pain Pain Assessment: 0-10 Pain Score: 5  Pain Location: R knee Pain Descriptors / Indicators: Aching;Sore Pain Intervention(s): Limited activity within patient's tolerance;Monitored during session;Premedicated before session;Ice applied    Home Living                      Prior Function            PT Goals (current goals can now be found in the care plan section) Acute Rehab PT Goals Patient Stated Goal: pain to improve PT Goal Formulation: With patient Time For Goal Achievement: 12/06/17 Potential to Achieve Goals: Good Progress towards PT goals: Progressing toward goals    Frequency    7X/week      PT Plan Current plan remains appropriate    Co-evaluation  AM-PAC PT "6 Clicks" Daily Activity  Outcome Measure  Difficulty turning over in bed (including adjusting bedclothes, sheets and blankets)?: A Little Difficulty moving from lying on back to sitting on the side of the bed? : A Little Difficulty sitting down on and standing up from a chair with arms (e.g., wheelchair, bedside commode, etc,.)?: A Little Help needed moving to and from a bed to chair (including a wheelchair)?: A Little Help needed walking in hospital room?: A Little Help needed climbing 3-5 steps with a railing? : A  Little 6 Click Score: 18    End of Session Equipment Utilized During Treatment: Gait belt;Right knee immobilizer Activity Tolerance: Patient tolerated treatment well Patient left: in chair;with call bell/phone within reach Nurse Communication: Mobility status PT Visit Diagnosis: Difficulty in walking, not elsewhere classified (R26.2)     Time: 0820-0900 PT Time Calculation (min) (ACUTE ONLY): 40 min  Charges:  $Gait Training: 8-22 mins $Therapeutic Exercise: 8-22 mins $Therapeutic Activity: 8-22 mins                    G Codes:       Pg [phone]    Aramis Zobel 11/24/2017, 9:14 AM

## 2017-11-25 ENCOUNTER — Encounter (HOSPITAL_COMMUNITY): Payer: Self-pay | Admitting: Orthopaedic Surgery

## 2017-11-25 ENCOUNTER — Inpatient Hospital Stay (HOSPITAL_COMMUNITY)
Admission: EM | Admit: 2017-11-25 | Discharge: 2017-11-27 | Disposition: A | Discharge status: Home / Self Care | Payer: BLUE CROSS/BLUE SHIELD | Source: Home / Self Care | Attending: Orthopedic Surgery | Admitting: Orthopedic Surgery

## 2017-11-25 ENCOUNTER — Other Ambulatory Visit (INDEPENDENT_AMBULATORY_CARE_PROVIDER_SITE_OTHER): Payer: Self-pay

## 2017-11-25 DIAGNOSIS — M79604 Pain in right leg: Secondary | ICD-10-CM

## 2017-11-25 DIAGNOSIS — Z79891 Long term (current) use of opiate analgesic: Secondary | ICD-10-CM | POA: Diagnosis not present

## 2017-11-25 DIAGNOSIS — T8149XA Infection following a procedure, other surgical site, initial encounter: Secondary | ICD-10-CM | POA: Diagnosis not present

## 2017-11-25 DIAGNOSIS — K219 Gastro-esophageal reflux disease without esophagitis: Secondary | ICD-10-CM | POA: Diagnosis not present

## 2017-11-25 DIAGNOSIS — Z9181 History of falling: Secondary | ICD-10-CM | POA: Diagnosis not present

## 2017-11-25 DIAGNOSIS — J45909 Unspecified asthma, uncomplicated: Secondary | ICD-10-CM | POA: Diagnosis not present

## 2017-11-25 DIAGNOSIS — R600 Localized edema: Secondary | ICD-10-CM | POA: Diagnosis not present

## 2017-11-25 DIAGNOSIS — L03115 Cellulitis of right lower limb: Secondary | ICD-10-CM | POA: Diagnosis not present

## 2017-11-25 DIAGNOSIS — Z7951 Long term (current) use of inhaled steroids: Secondary | ICD-10-CM | POA: Diagnosis not present

## 2017-11-25 DIAGNOSIS — M25561 Pain in right knee: Secondary | ICD-10-CM | POA: Diagnosis not present

## 2017-11-25 DIAGNOSIS — M7989 Other specified soft tissue disorders: Secondary | ICD-10-CM

## 2017-11-25 DIAGNOSIS — Z96651 Presence of right artificial knee joint: Secondary | ICD-10-CM | POA: Diagnosis not present

## 2017-11-25 LAB — LACTIC ACID, PLASMA: Lactic Acid, Venous: 1.4 mmol/L (ref 0.5–1.9)

## 2017-11-25 LAB — CBC WITH DIFFERENTIAL/PLATELET
Abs Immature Granulocytes: 0.1 10*3/uL (ref 0.0–0.1)
Basophils Absolute: 0.1 10*3/uL (ref 0.0–0.1)
Basophils Relative: 1 %
Eosinophils Absolute: 0.4 10*3/uL (ref 0.0–0.7)
Eosinophils Relative: 4 %
HCT: 37.8 % — ABNORMAL LOW (ref 39.0–52.0)
Hemoglobin: 12.6 g/dL — ABNORMAL LOW (ref 13.0–17.0)
Immature Granulocytes: 1 %
Lymphocytes Relative: 14 %
Lymphs Abs: 1.6 10*3/uL (ref 0.7–4.0)
MCH: 30.1 pg (ref 26.0–34.0)
MCHC: 33.3 g/dL (ref 30.0–36.0)
MCV: 90.2 fL (ref 78.0–100.0)
Monocytes Absolute: 1.2 10*3/uL — ABNORMAL HIGH (ref 0.1–1.0)
Monocytes Relative: 11 %
Neutro Abs: 7.6 10*3/uL (ref 1.7–7.7)
Neutrophils Relative %: 69 %
Platelets: 306 10*3/uL (ref 150–400)
RBC: 4.19 MIL/uL — ABNORMAL LOW (ref 4.22–5.81)
RDW: 12.5 % (ref 11.5–15.5)
WBC: 10.9 10*3/uL — ABNORMAL HIGH (ref 4.0–10.5)

## 2017-11-25 LAB — BASIC METABOLIC PANEL
Anion gap: 8 (ref 5–15)
BUN: 8 mg/dL (ref 6–20)
CO2: 29 mmol/L (ref 22–32)
Calcium: 8.4 mg/dL — ABNORMAL LOW (ref 8.9–10.3)
Chloride: 98 mmol/L — ABNORMAL LOW (ref 101–111)
Creatinine, Ser: 1.05 mg/dL (ref 0.61–1.24)
GFR calc Af Amer: >60 mL/min (ref >60)
GFR calc non Af Amer: >60 mL/min (ref >60)
Glucose, Bld: 104 mg/dL — ABNORMAL HIGH (ref 65–99)
Potassium: 4.4 mmol/L (ref 3.5–5.1)
Sodium: 135 mmol/L (ref 135–145)

## 2017-11-25 MED ORDER — OXYCODONE HCL 5 MG PO TABS
5.0000 mg | ORAL_TABLET | ORAL | Status: DC | PRN
Start: 2017-11-25 — End: 2017-11-26
  Administered 2017-11-25: 10 mg via ORAL
  Filled 2017-11-25: qty 2

## 2017-11-25 NOTE — ED Notes (Signed)
Update given to pt and family on wait time, apologized for delay

## 2017-11-25 NOTE — ED Provider Notes (Signed)
Patient placed in Quick Look pathway, seen and evaluated   Chief Complaint: right leg pain  HPI:  Marcus Watson is a 57 y.o. who presents to the ED for right leg pain and swelling. Pt sent here for DVT R/O. Pt had R total knee replacement on Friday. Now has swollen R leg, redness, warm to touch. patient had total right knee replace by Dr. Ninfa Linden 4 days ago. Patient d/c home yesterday and the symptoms started today around 6 pm. Patient reports fever and chills. Temp up to 101.2. Patient called ortho and then PT saw him and scheduled patient for DVT study for tomorrow.   ROS: M/S right leg pain and swelling  Physical Exam:  BP 135/72 (BP Location: Right Arm)   Pulse 77   Temp 99.3 F (37.4 C) (Oral)   Resp 16   Ht 6\' 2"  (1.88 m)   Wt 106.6 kg (235 lb)   SpO2 100%   BMI 30.17 kg/m    Gen: No distress  Neuro: Awake and Alert  Skin: redness and swelling to the right knee, increased warmth, red streaking.       Initiation of care has begun. The patient has been counseled on the process, plan, and necessity for staying for the completion/evaluation, and the remainder of the medical screening examination    Ashley Murrain, NP 11/25/17 2034    Varney Biles, MD 11/27/17 [phone]

## 2017-11-25 NOTE — ED Triage Notes (Signed)
Pt sent here for DVT R/O. Pt had R total knee replacement on Friday. Now has swollen R leg, redness, warm to touch.

## 2017-11-26 ENCOUNTER — Inpatient Hospital Stay (HOSPITAL_COMMUNITY): Payer: BLUE CROSS/BLUE SHIELD

## 2017-11-26 ENCOUNTER — Ambulatory Visit (HOSPITAL_COMMUNITY): Admission: RE | Admit: 2017-11-26 | Payer: BLUE CROSS/BLUE SHIELD | Source: Ambulatory Visit

## 2017-11-26 DIAGNOSIS — Z9889 Other specified postprocedural states: Secondary | ICD-10-CM

## 2017-11-26 DIAGNOSIS — L03115 Cellulitis of right lower limb: Secondary | ICD-10-CM

## 2017-11-26 LAB — BASIC METABOLIC PANEL
Anion gap: 9 (ref 5–15)
BUN: 7 mg/dL (ref 6–20)
CO2: 24 mmol/L (ref 22–32)
Calcium: 8.1 mg/dL — ABNORMAL LOW (ref 8.9–10.3)
Chloride: 102 mmol/L (ref 101–111)
Creatinine, Ser: 0.96 mg/dL (ref 0.61–1.24)
GFR calc Af Amer: >60 mL/min (ref >60)
GFR calc non Af Amer: >60 mL/min (ref >60)
Glucose, Bld: 132 mg/dL — ABNORMAL HIGH (ref 65–99)
Potassium: 3.5 mmol/L (ref 3.5–5.1)
Sodium: 135 mmol/L (ref 135–145)

## 2017-11-26 LAB — CBC WITH DIFFERENTIAL/PLATELET
Abs Immature Granulocytes: 0.1 10*3/uL (ref 0.0–0.1)
Basophils Absolute: 0 10*3/uL (ref 0.0–0.1)
Basophils Relative: 0 %
Eosinophils Absolute: 0.3 10*3/uL (ref 0.0–0.7)
Eosinophils Relative: 4 %
HCT: 35.8 % — ABNORMAL LOW (ref 39.0–52.0)
Hemoglobin: 12.1 g/dL — ABNORMAL LOW (ref 13.0–17.0)
Immature Granulocytes: 1 %
Lymphocytes Relative: 15 %
Lymphs Abs: 1.4 10*3/uL (ref 0.7–4.0)
MCH: 30.6 pg (ref 26.0–34.0)
MCHC: 33.8 g/dL (ref 30.0–36.0)
MCV: 90.6 fL (ref 78.0–100.0)
Monocytes Absolute: 0.9 10*3/uL (ref 0.1–1.0)
Monocytes Relative: 10 %
Neutro Abs: 6.3 10*3/uL (ref 1.7–7.7)
Neutrophils Relative %: 70 %
Platelets: 309 10*3/uL (ref 150–400)
RBC: 3.95 MIL/uL — ABNORMAL LOW (ref 4.22–5.81)
RDW: 12.7 % (ref 11.5–15.5)
WBC: 9 10*3/uL (ref 4.0–10.5)

## 2017-11-26 LAB — PROTIME-INR
INR: 1.13
Prothrombin Time: 14.4 seconds (ref 11.4–15.2)

## 2017-11-26 MED ORDER — OXYCODONE HCL 5 MG PO TABS
5.0000 mg | ORAL_TABLET | ORAL | Status: DC | PRN
  Administered 2017-11-26 (×2): 5 mg via ORAL
  Filled 2017-11-26 (×2): qty 1

## 2017-11-26 MED ORDER — SODIUM CHLORIDE 0.9 % IV SOLN
2000.0000 mg | Freq: Once | INTRAVENOUS | Status: AC
  Administered 2017-11-26: 2000 mg via INTRAVENOUS
  Filled 2017-11-26: qty 2000

## 2017-11-26 MED ORDER — ACETAMINOPHEN 325 MG PO TABS
650.0000 mg | ORAL_TABLET | Freq: Four times a day (QID) | ORAL | Status: DC | PRN
Start: 2017-11-26 — End: 2017-11-27
  Administered 2017-11-26 (×2): 650 mg via ORAL
  Filled 2017-11-26 (×2): qty 2

## 2017-11-26 MED ORDER — RIVAROXABAN 15 MG PO TABS
15.0000 mg | ORAL_TABLET | Freq: Two times a day (BID) | ORAL | Status: DC
  Administered 2017-11-26 – 2017-11-27 (×4): 15 mg via ORAL
  Filled 2017-11-26 (×5): qty 1

## 2017-11-26 MED ORDER — MOMETASONE FURO-FORMOTEROL FUM 100-5 MCG/ACT IN AERO
2.0000 | INHALATION_SPRAY | Freq: Two times a day (BID) | RESPIRATORY_TRACT | Status: DC
  Administered 2017-11-26 – 2017-11-27 (×2): 2 via RESPIRATORY_TRACT
  Filled 2017-11-26: qty 8.8

## 2017-11-26 MED ORDER — SODIUM CHLORIDE 0.9% FLUSH
3.0000 mL | Freq: Two times a day (BID) | INTRAVENOUS | Status: DC
  Administered 2017-11-26 – 2017-11-27 (×2): 3 mL via INTRAVENOUS

## 2017-11-26 MED ORDER — CIPROFLOXACIN IN D5W 400 MG/200ML IV SOLN
400.0000 mg | Freq: Two times a day (BID) | INTRAVENOUS | Status: DC
  Administered 2017-11-26 – 2017-11-27 (×4): 400 mg via INTRAVENOUS
  Filled 2017-11-26 (×4): qty 200

## 2017-11-26 MED ORDER — SODIUM CHLORIDE 0.9 % IV SOLN: 250.0000 mL | INTRAVENOUS | Status: DC | PRN

## 2017-11-26 MED ORDER — POLYETHYLENE GLYCOL 3350 17 G PO PACK: 17.0000 g | PACK | Freq: Every day | ORAL | Status: DC | PRN

## 2017-11-26 MED ORDER — ALBUTEROL SULFATE HFA 108 (90 BASE) MCG/ACT IN AERS
1.0000 | INHALATION_SPRAY | RESPIRATORY_TRACT | Status: DC | PRN
Start: 2017-11-26 — End: 2017-11-26

## 2017-11-26 MED ORDER — OXYCODONE HCL 5 MG PO TABS
10.0000 mg | ORAL_TABLET | ORAL | Status: DC | PRN
  Administered 2017-11-26 – 2017-11-27 (×6): 10 mg via ORAL
  Filled 2017-11-26 (×6): qty 2

## 2017-11-26 MED ORDER — VANCOMYCIN HCL IN DEXTROSE 1-5 GM/200ML-% IV SOLN
1000.0000 mg | Freq: Three times a day (TID) | INTRAVENOUS | Status: DC
  Administered 2017-11-26 – 2017-11-27 (×4): 1000 mg via INTRAVENOUS
  Filled 2017-11-26 (×5): qty 200

## 2017-11-26 MED ORDER — SODIUM CHLORIDE 0.9% FLUSH: 3.0000 mL | INTRAVENOUS | Status: DC | PRN

## 2017-11-26 MED ORDER — ALBUTEROL SULFATE (2.5 MG/3ML) 0.083% IN NEBU: 2.5000 mg | INHALATION_SOLUTION | Freq: Four times a day (QID) | RESPIRATORY_TRACT | Status: DC | PRN

## 2017-11-26 MED ORDER — METHOCARBAMOL 500 MG PO TABS
500.0000 mg | ORAL_TABLET | Freq: Four times a day (QID) | ORAL | Status: DC | PRN
Start: 2017-11-26 — End: 2017-11-27
  Administered 2017-11-26 – 2017-11-27 (×5): 500 mg via ORAL
  Filled 2017-11-26 (×5): qty 1

## 2017-11-26 MED ORDER — DOCUSATE SODIUM 100 MG PO CAPS
100.0000 mg | ORAL_CAPSULE | Freq: Two times a day (BID) | ORAL | Status: DC
  Administered 2017-11-26 – 2017-11-27 (×3): 100 mg via ORAL
  Filled 2017-11-26 (×3): qty 1

## 2017-11-26 MED ORDER — ACETAMINOPHEN 650 MG RE SUPP
650.0000 mg | Freq: Four times a day (QID) | RECTAL | Status: DC | PRN
Start: 2017-11-26 — End: 2017-11-27

## 2017-11-26 NOTE — Progress Notes (Signed)
Patient ID: Marcus Watson, male   DOB: [birth year], 57 y.o.   MRN: [id number] Patient has red streaking up the medial aspect of the right thigh.  The surgical incision is stable with no signs of cellulitis around the knee.  Patient still has significant swelling.  Awaiting ultrasound.  Patient's symptoms seem to be consistent with a DVT at this time.  Patient's white cell count is 9000.  Negative Homans sign.  Patient has received a therapeutic dose of Xarelto twice daily

## 2017-11-26 NOTE — ED Notes (Addendum)
Dr.Duda, Ortho MD at bedside

## 2017-11-26 NOTE — Progress Notes (Signed)
Right lower extremity venous duplex has been completed. Negative for DVT. Results were given to the patient's nurse, Kristi.  11/26/17 11:56 AM Marcus Watson RVT

## 2017-11-26 NOTE — Progress Notes (Addendum)
Pharmacy Antibiotic Note  Marcus Watson is a 57 y.o. male admitted on 11/25/2017 with R knee pain/swelling s/p R TKA 6/7 .  Pharmacy has been consulted for Vancomycin and Cipro dosing.  Plan: Vancomycin 2 g IV now, then 1 g IV q8h Cipro 400 mg IV q12h  Height: 6\' 2"  (188 cm) Weight: 235 lb (106.6 kg) IBW/kg (Calculated) : 82.2  Temp (24hrs), Avg:99.2 F (37.3 C), Min:99.1 F (37.3 C), Max:99.3 F (37.4 C)  Recent Labs  Lab 11/23/17 0547 11/25/17 2046  WBC 16.7* 10.9*  CREATININE 1.06 1.05  LATICACIDVEN  --  1.4    Estimated Creatinine Clearance: 101 mL/min (by C-G formula based on SCr of 1.05 mg/dL).    Allergies  Allergen Reactions  . Penicillins Anaphylaxis    Has patient had a PCN reaction causing immediate rash, facial/tongue/throat swelling, SOB or lightheadedness with hypotension: No Has patient had a PCN reaction causing severe rash involving mucus membranes or skin necrosis: No Has patient had a PCN reaction that required hospitalization: No Has patient had a PCN reaction occurring within the last 10 years: No If all of the above answers are "NO", then may proceed with Cephalosporin use.  [place] Extracts [Gramineae Pollens]   . Other     Trees, pollen, mildew, cats, dogs      Marcus Watson, Marcus Watson 11/26/2017 1:24 AM

## 2017-11-26 NOTE — ED Notes (Signed)
Attempted report 

## 2017-11-26 NOTE — ED Provider Notes (Signed)
[facility] EMERGENCY DEPARTMENT Provider Note   CSN: [id number] Arrival date & time: 11/25/17  1902    History   Chief Complaint Chief Complaint  Patient presents with  . Leg Swelling    HPI Marcus Watson is a 57 y.o. male.  The history is provided by the patient.  Leg Pain   This is a new problem. The current episode started more than 2 days ago. The problem occurs constantly. The problem has been gradually worsening. The pain is present in the right knee. The quality of the pain is described as aching. The pain is moderate. Associated symptoms include stiffness. He has tried rest (oral pain meds) for the symptoms. The treatment provided no relief.  Patient had a right total knee arthroplasty done on June 7 He did well postoperatively and was discharged.  However over the past 24 to 48 hours had increasing pain and swelling in the right leg.  He reports redness.  He has had a fever above 100 home.  No chest pain or shortness of breath.  No chills or vomiting.  He was sent in for further evaluation Past Medical History:  Diagnosis Date  . Allergy   . Eosinophilic esophagitis 9/0/2409  . Erectile dysfunction   . Esophageal laceration 12/04/2016  . Food impaction of esophagus 12/04/2016  . GERD with stricture   . Melanoma ([place])    back  . Mild asthma   . Seasonal allergies   . Testosterone deficiency     Patient Active Problem List   Diagnosis Date Noted  . Status post revision of total knee, right 11/22/2017  . Status post arthroscopy of right knee 07/10/2017  . Unilateral primary osteoarthritis, right knee 07/10/2017  . GERD with stricture 03/19/2017  . Pain in right hip 03/04/2017  . Acute pain of right knee 03/04/2017  . Trochanteric bursitis, right hip 03/04/2017  . Eosinophilic esophagitis 73/53/2992    Past Surgical History:  Procedure Laterality Date  . [place]  2015   in [place]  . ESOPHAGOGASTRODUODENOSCOPY N/A 12/04/2016   Procedure:  ESOPHAGOGASTRODUODENOSCOPY (EGD);  Surgeon: [name], MD;  Location: [facility] ENDOSCOPY;  Service: Endoscopy;  Laterality: N/A;  . HERNIA REPAIR    . KNEE ARTHROCENTESIS    . KNEE ARTHROSCOPY Right   . NASAL SINUS SURGERY    . TONSILLECTOMY    . TOTAL KNEE ARTHROPLASTY Right 11/22/2017   Procedure: RIGHT TOTAL KNEE ARTHROPLASTY;  Surgeon: Mcarthur Rossetti, MD;  Location: [facility] ORS;  Service: Orthopedics;  Laterality: Right;  Adductor Block        Home Medications    Prior to Admission medications   Medication Sig Start Date End Date Taking? Authorizing Provider  albuterol (PROVENTIL HFA;VENTOLIN HFA) 108 (90 Base) MCG/ACT inhaler Inhale 1 puff into the lungs as needed for wheezing.  12/28/15 11/12/17  [provider]  aspirin 81 MG chewable tablet Chew 1 tablet (81 mg total) by mouth 2 (two) times daily. 11/23/17   Mcarthur Rossetti, MD  cetirizine (ZYRTEC) 10 MG tablet Take 10 mg by mouth at bedtime.     [provider]  Cholecalciferol (VITAMIN D-1000 MAX ST) 1000 units tablet Take 1,000 Units by mouth daily.    [provider]  DYMISTA 137-50 MCG/ACT SUSP Place 1 spray into both nostrils every morning.  11/30/16   [provider]  Fluticasone-Salmeterol (ADVAIR DISKUS) 100-50 MCG/DOSE AEPB Inhale 1 puff into the lungs at bedtime as needed.    [provider]  methocarbamol (ROBAXIN) 500 MG tablet Take 1 tablet (500 mg total) by mouth every 6 (six) hours as needed for muscle spasms. 11/23/17   Mcarthur Rossetti, MD  Naphazoline-Glycerin-Zinc Sulf (CLEAR EYES SEASONAL RELIEF) 0.012-0.2-0.25 % SOLN Apply 1 drop to eye 2 (two) times daily as needed.    [provider]  oxyCODONE (OXY IR/ROXICODONE) 5 MG immediate release tablet Take 1-2 tablets (5-10 mg total) by mouth every 4 (four) hours as needed for moderate pain (pain score 4-6). 11/23/17   Mcarthur Rossetti, MD  pantoprazole (PROTONIX) 40 MG tablet Take 40 mg by mouth 2  (two) times daily.  07/24/17   [name], MD  sildenafil (REVATIO) 20 MG tablet Take 20 mg by mouth daily as needed.  11/01/16   [provider]  testosterone cypionate (DEPO-TESTOSTERONE) 200 MG/ML injection Inject 200 mg into the muscle See admin instructions. Every two weeks 09/24/16   [provider]    Family History Family History  Problem Relation Age of Onset  . Hypertension Other   . Colon cancer Neg Hx   . Esophageal cancer Neg Hx   . Rectal cancer Neg Hx   . Stomach cancer Neg Hx     Social History Social History   Tobacco Use  . Smoking status: Never Smoker  . Smokeless tobacco: Former Systems developer    Types: Snuff  Substance Use Topics  . Alcohol use: Yes    Comment: occassional  . Drug use: No     Allergies   Penicillins; Grass extracts [gramineae pollens]; and Other   Review of Systems Review of Systems  Constitutional: Negative for fever.  Musculoskeletal: Positive for stiffness.  All other systems reviewed and are negative.    Physical Exam Updated Vital Signs BP 139/78 (BP Location: Right Arm)   Pulse 76   Temp 99.1 F (37.3 C) (Oral)   Resp 14   Ht 1.88 m (6\' 2" )   Wt 106.6 kg (235 lb)   SpO2 98%   BMI 30.17 kg/m   Physical Exam  CONSTITUTIONAL: Well developed/well nourished HEAD: Normocephalic/atraumatic EYES: EOMI ENMT: Mucous membranes moist NECK: supple no meningeal signs SPINE/BACK:entire spine nontender CV: S1/S2 noted, no murmurs/rubs/gallops noted LUNGS: Lungs are clear to auscultation bilaterally, no apparent distress ABDOMEN: soft, nontender NEURO: Pt is awake/alert/appropriate, moves all extremitiesx4.  No facial droop.   EXTREMITIES: pulses normal/equal, full ROM Significant erythema noted right thigh that is extending proximally.  There is diffuse tenderness.  No crepitus.  He has diffuse edema throughout the right leg.  Distal pulses are intact.  See photo below SKIN: warm, color normal PSYCH: no  abnormalities of mood noted, alert and oriented to situation     Patient gave verbal permission to utilize photo for medical documentation only The image was not stored on any personal device ED Treatments / Results  Labs (all labs ordered are listed, but only abnormal results are displayed) Labs Reviewed  CBC WITH DIFFERENTIAL/PLATELET - Abnormal; Notable for the following components:      Result Value   WBC 10.9 (*)    RBC 4.19 (*)    Hemoglobin 12.6 (*)    HCT 37.8 (*)    Monocytes Absolute 1.2 (*)    All other components within normal limits  BASIC METABOLIC PANEL - Abnormal; Notable for the following components:   Chloride 98 (*)    Glucose, Bld 104 (*)    Calcium 8.4 (*)    All other components within normal  limits  LACTIC ACID, PLASMA    EKG None  Radiology No results found.  Procedures Procedures    Medications Ordered in ED Medications  oxyCODONE (Oxy IR/ROXICODONE) immediate release tablet 5-10 mg (10 mg Oral Given 11/25/17 2234)     Initial Impression / Assessment and Plan / ED Course  I have reviewed the triage vital signs and the nursing notes.  Pertinent labs  results that were available during my care of the patient were reviewed by me and considered in my medical decision making (see chart for details).     12:55 AM Patient sent in for right leg pain and swelling since recent right total knee.  I am concerned for cellulitis.  Discussed with Dr. Sharol Given  who is on-call for Dr. Ninfa Linden he will see the patient   Patient seen by orthopedics and will be admitted for concern for cellulitis.  Empirically given oral anticoagulants pending DVT study Final Clinical Impressions(s) / ED Diagnoses   Final diagnoses:  Cellulitis of right knee    ED Discharge Orders    None       Ripley Fraise, MD 11/26/17 0136

## 2017-11-26 NOTE — Consult Note (Signed)
ORTHOPAEDIC CONSULTATION  REQUESTING PHYSICIAN: Ripley Fraise, MD  Chief Complaint: Pain swelling redness right knee.  HPI: Marcus Watson is a 57 y.o. male who presents with increased redness and swelling right knee status post total knee arthroplasty on Friday.  Past Medical History:  Diagnosis Date  . Allergy   . Eosinophilic esophagitis 0/06/6008  . Erectile dysfunction   . Esophageal laceration 12/04/2016  . Food impaction of esophagus 12/04/2016  . GERD with stricture   . Melanoma ([place])    back  . Mild asthma   . Seasonal allergies   . Testosterone deficiency    Past Surgical History:  Procedure Laterality Date  . [place]OS[place]  2015   in [place]  . ESOPHAGOGASTRODUODENOSCOPY N/A 12/04/2016   Procedure: ESOPHAGOGASTRODUODENOSCOPY (EGD);  Surgeon: [name], MD;  Location: [facility] ENDOSCOPY;  Service: Endoscopy;  Laterality: N/A;  . HERNIA REPAIR    . KNEE ARTHROCENTESIS    . KNEE ARTHROSCOPY Right   . NASAL SINUS SURGERY    . TONSILLECTOMY    . TOTAL KNEE ARTHROPLASTY Right 11/22/2017   Procedure: RIGHT TOTAL KNEE ARTHROPLASTY;  Surgeon: Mcarthur Rossetti, MD;  Location: [facility] ORS;  Service: Orthopedics;  Laterality: Right;  Adductor Block   Social History   Socioeconomic History  . Marital status: Married    Spouse name: Linus Orn  . Number of children: Not on file  . Years of education: Not on file  . Highest education level: Not on file  Occupational History    Employer: [employer]  Social Needs  . Financial resource strain: Not on file  . Food insecurity:    Worry: Not on file    Inability: Not on file  . Transportation needs:    Medical: Not on file    Non-medical: Not on file  Tobacco Use  . Smoking status: Never Smoker  . Smokeless tobacco: Former Systems developer    Types: Snuff  Substance and Sexual Activity  . Alcohol use: Yes    Comment: occassional  . Drug use: No  . Sexual activity: Not on file  Lifestyle  . Physical activity:   Days per week: Not on file    Minutes per session: Not on file  . Stress: Not on file  Relationships  . Social connections:    Talks on phone: Not on file    Gets together: Not on file    Attends religious service: Not on file    Active member of club or organization: Not on file    Attends meetings of clubs or organizations: Not on file    Relationship status: Not on file  Other Topics Concern  . Not on file  Social History Narrative   Married - from [place] area - moved to [place] approx 2015 to work for [employer]   Family History  Problem Relation Age of Onset  . Hypertension Other   . Colon cancer Neg Hx   . Esophageal cancer Neg Hx   . Rectal cancer Neg Hx   . Stomach cancer Neg Hx    - negative except otherwise stated in the family history section Allergies  Allergen Reactions  . Penicillins Anaphylaxis    Has patient had a PCN reaction causing immediate rash, facial/tongue/throat swelling, SOB or lightheadedness with hypotension: No Has patient had a PCN reaction causing severe rash involving mucus membranes or skin necrosis: No Has patient had a PCN reaction that required hospitalization: No Has patient had a PCN reaction  occurring within the last 10 years: No If all of the above answers are "NO", then may proceed with Cephalosporin use.  [place] Extracts [Gramineae Pollens]   . Other     Trees, pollen, mildew, cats, dogs    Prior to Admission medications   Medication Sig Start Date End Date Taking? Authorizing Provider  albuterol (PROVENTIL HFA;VENTOLIN HFA) 108 (90 Base) MCG/ACT inhaler Inhale 1 puff into the lungs as needed for wheezing.  12/28/15 11/12/17  [provider]  aspirin 81 MG chewable tablet Chew 1 tablet (81 mg total) by mouth 2 (two) times daily. 11/23/17   Mcarthur Rossetti, MD  cetirizine (ZYRTEC) 10 MG tablet Take 10 mg by mouth at bedtime.     [provider]  Cholecalciferol (VITAMIN D-1000 MAX ST) 1000  units tablet Take 1,000 Units by mouth daily.    [provider]  DYMISTA 137-50 MCG/ACT SUSP Place 1 spray into both nostrils every morning.  11/30/16   [provider]  Fluticasone-Salmeterol (ADVAIR DISKUS) 100-50 MCG/DOSE AEPB Inhale 1 puff into the lungs at bedtime as needed.    [provider]  methocarbamol (ROBAXIN) 500 MG tablet Take 1 tablet (500 mg total) by mouth every 6 (six) hours as needed for muscle spasms. 11/23/17   Mcarthur Rossetti, MD  Naphazoline-Glycerin-Zinc Sulf (CLEAR EYES SEASONAL RELIEF) 0.012-0.2-0.25 % SOLN Apply 1 drop to eye 2 (two) times daily as needed.    [provider]  oxyCODONE (OXY IR/ROXICODONE) 5 MG immediate release tablet Take 1-2 tablets (5-10 mg total) by mouth every 4 (four) hours as needed for moderate pain (pain score 4-6). 11/23/17   Mcarthur Rossetti, MD  pantoprazole (PROTONIX) 40 MG tablet Take 40 mg by mouth 2 (two) times daily.  07/24/17   [name], MD  sildenafil (REVATIO) 20 MG tablet Take 20 mg by mouth daily as needed.  11/01/16   [provider]  testosterone cypionate (DEPO-TESTOSTERONE) 200 MG/ML injection Inject 200 mg into the muscle See admin instructions. Every two weeks 09/24/16   [provider]   No results found. - pertinent xrays, CT, MRI studies were reviewed and independently interpreted  Positive ROS: All other systems have been reviewed and were otherwise negative with the exception of those mentioned in the HPI and as above.  Physical Exam: General: Alert, no acute distress Psychiatric: Patient is competent for consent with normal mood and affect Lymphatic: No axillary or cervical lymphadenopathy Cardiovascular: No pedal edema Respiratory: No cyanosis, no use of accessory musculature GI: No organomegaly, abdomen is soft and non-tender    Images:  @ENCIMAGES @  Labs:  No results found for: HGBA1C, ESRSEDRATE, CRP, LABURIC, REPTSTATUS, GRAMSTAIN, CULT,  LABORGA  No results found for: ALBUMIN, PREALBUMIN, LABURIC  Neurologic: Patient does have protective sensation bilateral lower extremities.  Vitals patient reported temperature up to 100.  Temperature in the emergency room was 99 white cell count 10,000. MUSCULOSKELETAL:   Skin: Examination patient has redness extending up to the groin medially.  Patient does have some tenderness to palpation.  Dorsiflexion both active and passively with the knee extended does not reproduce any pain.  He has a little bit of pain to palpation along the greater saphenous vein but not significant.  There is ecchymosis and bruising laterally.  There is no redness directly around the knee does not appear to be intra-articular sepsis.  Assessment: Assessment: 3 days status post total knee arthroplasty with increased redness and swelling medial aspect of the  right thigh with bruising laterally.  Plan: Plan: We will have patient admitted the ultrasound to rule out DVT was not obtained this evening.  Will start patient on vancomycin and Zosyn to cover for possible soft tissue infection will obtain a Doppler in the morning to rule out DVT.  Will start Xarelto 15 mg twice daily to cover for possible DVT.  Thank you for the consult and the opportunity to see Marcus Watson, [place] [phone] 1:07 AM

## 2017-11-27 DIAGNOSIS — M25561 Pain in right knee: Secondary | ICD-10-CM

## 2017-11-27 LAB — URINE CULTURE: Culture: NO GROWTH

## 2017-11-27 MED ORDER — DOXYCYCLINE HYCLATE 100 MG PO TABS: 100.0000 mg | ORAL_TABLET | Freq: Two times a day (BID) | ORAL | 0 refills | Status: DC

## 2017-11-27 NOTE — Progress Notes (Signed)
Patient ID: Marcus Watson, male   DOB: 1961-06-09, 57 y.o.   MRN: [id number] Examination with elevations the swelling and patient's leg has significantly decreased his calf is now soft nontender to palpation.  The thigh is soft and nontender to palpation the surgical incision has no dehiscence no redness no cellulitis.  The red streaking up the medial thigh has resolved.  Laboratory studies ultrasound was negative for DVT.  Trended downward no signs of infection.  Recommended elevation of the leg above his heart recommend that he get a pair of double extra-large Vive compression stockings to go over his knee to facilitate edema control.

## 2017-11-27 NOTE — Discharge Instructions (Signed)
elevation of the leg above his heart recommend that he get a pair of double extra-large Vive compression stockings to go over his knee to facilitate edema control.

## 2017-11-27 NOTE — Progress Notes (Signed)
Discharge instructions completed with pt. Pt verbalized understanding of the information.  Pt denies chest pain, shortness of breath, dizziness, lightheadedness, and n/v.  Pt discharged home.  

## 2017-11-27 NOTE — Discharge Summary (Signed)
Discharge Diagnoses:  Active Problems:   Cellulitis of right knee      Consultants: Vascular lab to rule out DVT.  Ultrasound negative for DVT.  Discharged Condition: Improved  Hospital Course: Marcus Watson is an 57 y.o. male who was admitted 11/25/2017 with a chief complaint of redness and swelling right lower extremity status post total knee arthroplasty, with a final diagnosis of swelling right lower extremity.  Patient was admitted started on IV antibiotics and started on Xarelto.  Ultrasound was negative for DVT.[name]    Patient was given vancomycin and Cipro antibiotics:  Anti-infectives (From admission, onward)   Start     Dose/Rate Route Frequency Ordered Stop   11/27/17 0000  doxycycline (VIBRA-TABS) 100 MG tablet     100 mg Oral 2 times daily 11/27/17 1303     11/26/17 1000  vancomycin (VANCOCIN) IVPB 1000 mg/200 mL premix     1,000 mg 200 mL/hr over 60 Minutes Intravenous Every 8 hours 11/26/17 0137     11/26/17 0200  vancomycin (VANCOCIN) 2,000 mg in sodium chloride 0.9 % 500 mL IVPB     2,000 mg 250 mL/hr over 120 Minutes Intravenous  Once 11/26/17 0137 11/26/17 0610   11/26/17 0200  ciprofloxacin (CIPRO) IVPB 400 mg     400 mg 200 mL/hr over 60 Minutes Intravenous 2 times daily 11/26/17 0137      .  Patient was given sequential compression devices, early ambulation, and Xarelto for DVT prophylaxis.  Recent vital signs:  Patient Vitals for the past 24 hrs:  BP Temp Temp src Pulse Resp SpO2  11/27/17 0931 - - - - - 99 %  11/27/17 0500 (!) 141/75 98.2 F (36.8 C) Oral 75 16 98 %  11/26/17 2105 - - - - - 100 %  11/26/17 2015 (!) 150/80 98 F (36.7 C) Oral 70 16 100 %  11/26/17 1400 131/71 98 F (36.7 C) Oral 72 16 98 %  .  Recent laboratory studies: No results found.  Discharge Medications:   Allergies as of 11/27/2017      Reactions   Penicillins Anaphylaxis   Has patient had a PCN reaction causing immediate rash, facial/tongue/throat swelling, SOB or  lightheadedness with hypotension: No Has patient had a PCN reaction causing severe rash involving mucus membranes or skin necrosis: No Has patient had a PCN reaction that required hospitalization: No Has patient had a PCN reaction occurring within the last 10 years: No If all of the above answers are "NO", then may proceed with Cephalosporin use.   Grass Extracts [gramineae Pollens]    Other    Trees, pollen, mildew, cats, dogs       Medication List    TAKE these medications   ADVAIR DISKUS 100-50 MCG/DOSE Aepb Generic drug:  Fluticasone-Salmeterol Inhale 1 puff into the lungs at bedtime as needed.   albuterol 108 (90 Base) MCG/ACT inhaler Commonly known as:  PROVENTIL HFA;VENTOLIN HFA Inhale 1 puff into the lungs as needed for wheezing.   aspirin 81 MG chewable tablet Chew 1 tablet (81 mg total) by mouth 2 (two) times daily.   cetirizine 10 MG tablet Commonly known as:  ZYRTEC Take 10 mg by mouth at bedtime.   CLEAR EYES SEASONAL RELIEF 0.012-0.2-0.25 % Soln Generic drug:  Naphazoline-Glycerin-Zinc Sulf Apply 1 drop to eye 2 (two) times daily as needed.   DEPO-TESTOSTERONE 200 MG/ML injection Generic drug:  testosterone cypionate Inject 200 mg into the muscle See admin instructions. Every two weeks  doxycycline 100 MG tablet Commonly known as:  VIBRA-TABS Take 1 tablet (100 mg total) by mouth 2 (two) times daily.   DYMISTA 137-50 MCG/ACT Susp Generic drug:  Azelastine-Fluticasone Place 1 spray into both nostrils every morning.   methocarbamol 500 MG tablet Commonly known as:  ROBAXIN Take 1 tablet (500 mg total) by mouth every 6 (six) hours as needed for muscle spasms.   oxyCODONE 5 MG immediate release tablet Commonly known as:  Oxy IR/ROXICODONE Take 1-2 tablets (5-10 mg total) by mouth every 4 (four) hours as needed for moderate pain (pain score 4-6).   pantoprazole 40 MG tablet Commonly known as:  PROTONIX Take 40 mg by mouth 2 (two) times daily.    sildenafil 20 MG tablet Commonly known as:  REVATIO Take 20 mg by mouth daily as needed.   VITAMIN D-1000 MAX ST 1000 units tablet Generic drug:  Cholecalciferol Take 1,000 Units by mouth daily.            Discharge Care Instructions  (From admission, onward)        Start     Ordered   11/27/17 0000  Weight bearing as tolerated     11/27/17 0700      Diagnostic Studies: Dg Knee Right Port  Result Date: 11/22/2017 CLINICAL DATA:  Right total knee arthroplasty EXAM: PORTABLE RIGHT KNEE - 1-2 VIEW COMPARISON:  None. FINDINGS: Status post right total knee arthroplasty with well-positioned right distal femoral, right proximal tibial and posterior patellar prostheses with no evidence of hardware fracture or loosening. No acute osseous fracture. No dislocation. No suspicious focal osseous lesions. Tiny superior right patellar enthesophyte. No significant joint effusion. Expected soft tissue gas surrounding the right knee joint anteriorly. IMPRESSION: Satisfactory immediate postoperative appearance status post right total knee arthroplasty. Electronically Signed   By: Ilona Sorrel M.D.   On: 11/22/2017 10:12    Patient benefited maximally from their hospital stay and there were no complications.     Disposition: Discharge disposition: 01-Home or Self Care      Discharge Instructions    Call MD / Call 911   Complete by:  As directed    If you experience chest pain or shortness of breath, CALL 911 and be transported to the hospital emergency room.  If you develope a fever above 101 F, pus (white drainage) or increased drainage or redness at the wound, or calf pain, call your surgeon's office.   Constipation Prevention   Complete by:  As directed    Drink plenty of fluids.  Prune juice may be helpful.  You may use a stool softener, such as Colace (over the counter) 100 mg twice a day.  Use MiraLax (over the counter) for constipation as needed.   Diet - low sodium heart healthy    Complete by:  As directed    Elevate operative extremity   Complete by:  As directed    Increase activity slowly as tolerated   Complete by:  As directed    Weight bearing as tolerated   Complete by:  As directed      Follow-up Information    Mcarthur Rossetti, MD Follow up in 1 week(s).   Specialty:  Orthopedic Surgery Contact information: [place] [address] [phone]        [facility] Follow up.   Specialty:  [place] Why:  [facility] will resume your home health therapy Contact information: [address]  [place] [address] [phone]            Signed: Newt Minion 11/27/2017, 1:03 PM

## 2017-11-27 NOTE — Evaluation (Signed)
Physical Therapy Evaluation Patient Details Name: Marcus Watson MRN: [id number] DOB: 1960-10-15 Today's Date: 11/27/2017   History of Present Illness  Pt is a 57 y/o male admitted secondary to R LE pain and swelling. Of note, pt with recent R TKA (d/c'd from [facility] on 6/9). [place] was negative for DVT. 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  Clinical Impression  Pt presented supine in bed with HOB elevated, awake and willing to participate in therapy session. Prior to admission, pt reported that he was ambulating with RW and required assistance from wife with ADLs. Pt stated that he had his initial [facility] PT evaluation but would have a re-evaluation upon returning home. Pt currently very limited secondary to pain. He required min A with return to supine, min guard for transfers and min guard for ambulation with RW. Pt would continue to benefit from skilled physical therapy services at this time while admitted and after d/c to address the below listed limitations in order to improve overall safety and independence with functional mobility.     Follow Up Recommendations Home health PT;Supervision/Assistance - 24 hour    Equipment Recommendations  None recommended by PT    Recommendations for Other Services       Precautions / Restrictions Precautions Precautions: Knee;Fall Precaution Comments: Reviewed positioning and re-inforced elevation and ice to decrease edema Restrictions Weight Bearing Restrictions: Yes RLE Weight Bearing: Weight bearing as tolerated      Mobility  Bed Mobility Overal bed mobility: Needs Assistance Bed Mobility: Supine to Sit;Sit to Supine     Supine to sit: Supervision Sit to supine: Min assist   General bed mobility comments: increased time and effort, assistance to return R LE onto bed  Transfers Overall transfer level: Needs assistance Equipment used: Rolling walker (2 wheeled) Transfers: Sit to/from Stand Sit to Stand: Min guard         General transfer comment: increased time and effort, cueing for safe hand placement, min guard for safety  Ambulation/Gait Ambulation/Gait assistance: Min guard Ambulation Distance (Feet): 40 Feet Assistive device: Rolling walker (2 wheeled) Gait Pattern/deviations: Step-to pattern;Decreased step length - left;Decreased stance time - right;Decreased stride length;Decreased weight shift to right;Antalgic Gait velocity: decreased Gait velocity interpretation: <1.31 ft/sec, indicative of household ambulator General Gait Details: pt very limited secondary to pain. pt steady with use of RW, good technique/sequencing, min guard for safety  Stairs            Wheelchair Mobility    Modified Rankin (Stroke Patients Only)       Balance Overall balance assessment: Needs assistance Sitting-balance support: Feet supported Sitting balance-Leahy Scale: Good     Standing balance support: During functional activity;Bilateral upper extremity supported Standing balance-Leahy Scale: Poor                               Pertinent Vitals/Pain Pain Assessment: 0-10 Pain Score: 10-Worst pain ever Pain Location: R knee Pain Descriptors / Indicators: Sore;Grimacing;Guarding Pain Intervention(s): Monitored during session;Repositioned;Premedicated before session    Home Living Family/patient expects to be discharged to:: Private residence Living Arrangements: Spouse/significant other Available Help at Discharge: Family;Available 24 hours/day Type of Home: House Home Access: Stairs to enter Entrance Stairs-Rails: None Entrance Stairs-Number of Steps: 2 Home Layout: One level Home Equipment: Walker - 2 wheels;Bedside commode      Prior Function Level of Independence: Needs assistance   Gait / Transfers Assistance Needed:  ambulating with RW  ADL's / Homemaking Assistance Needed: wife was assisting with dressing        Hand Dominance        Extremity/Trunk  Assessment   Upper Extremity Assessment Upper Extremity Assessment: Overall WFL for tasks assessed    Lower Extremity Assessment Lower Extremity Assessment: RLE deficits/detail RLE Deficits / Details: pt with decreased strength and AROM limitations secondary to pain. Noteable edema throughout lower leg and at knee. Pt with compression stocking in place. RLE: Unable to fully assess due to pain    Cervical / Trunk Assessment Cervical / Trunk Assessment: Normal  Communication   Communication: No difficulties  Cognition Arousal/Alertness: Awake/alert Behavior During Therapy: WFL for tasks assessed/performed Overall Cognitive Status: Within Functional Limits for tasks assessed                                        General Comments      Exercises     Assessment/Plan    PT Assessment Patient needs continued PT services  PT Problem List Decreased strength;Decreased range of motion;Decreased activity tolerance;Decreased balance;Decreased mobility;Decreased coordination;Decreased knowledge of use of DME;Decreased safety awareness;Decreased knowledge of precautions;Pain       PT Treatment Interventions DME instruction;Gait training;Stair training;Functional mobility training;Therapeutic exercise;Balance training;Therapeutic activities;Neuromuscular re-education;Patient/family education    PT Goals (Current goals can be found in the Care Plan section)  Acute Rehab PT Goals Patient Stated Goal: decrease pain PT Goal Formulation: With patient Time For Goal Achievement: 12/11/17 Potential to Achieve Goals: Good    Frequency 7X/week   Barriers to discharge        Co-evaluation               AM-PAC PT "6 Clicks" Daily Activity  Outcome Measure Difficulty turning over in bed (including adjusting bedclothes, sheets and blankets)?: A Little Difficulty moving from lying on back to sitting on the side of the bed? : A Little Difficulty sitting down on and  standing up from a chair with arms (e.g., wheelchair, bedside commode, etc,.)?: Unable Help needed moving to and from a bed to chair (including a wheelchair)?: A Little Help needed walking in hospital room?: A Little Help needed climbing 3-5 steps with a railing? : A Little 6 Click Score: 16    End of Session Equipment Utilized During Treatment: Gait belt Activity Tolerance: Patient limited by pain Patient left: in bed;with call bell/phone within reach;Other (comment)(LEs in elevation with bed in slight trendelenburg) Nurse Communication: Mobility status PT Visit Diagnosis: Other abnormalities of gait and mobility (R26.89);Pain Pain - Right/Left: Right Pain - part of body: Leg;Knee    Time: 9798-9211 PT Time Calculation (min) (ACUTE ONLY): 19 min   Charges:   PT Evaluation $PT Eval Moderate Complexity: 1 Mod     PT G Codes:        [place], PT, DPT [place] 11/27/2017, 10:16 AM

## 2017-11-27 NOTE — Plan of Care (Signed)
  Problem: Pain Managment: Goal: General experience of comfort will improve Outcome: Progressing   Problem: Coping: Goal: Level of anxiety will decrease Outcome: Progressing   

## 2017-11-27 NOTE — Discharge Summary (Signed)
Discharge Diagnoses:  Active Problems:   Cellulitis of right knee   Surgeries:  on * No surgery found *    Consultants:   Discharged Condition: Improved  Hospital Course: Marcus Watson is an 57 y.o. male who was admitted 11/25/2017 with a chief complaint of pain and swelling right knee, with a final diagnosis of swelling lower extremity right underwent ultrasound to rule out DVT this was negative for DVT.[name]  Patient patient's vital signs were stable and there was no signs or symptoms of infection and the antibiotics were discontinued. Patient was given  antibiotics:  Anti-infectives (From admission, onward)   Start     Dose/Rate Route Frequency Ordered Stop   11/26/17 1000  vancomycin (VANCOCIN) IVPB 1000 mg/200 mL premix     1,000 mg 200 mL/hr over 60 Minutes Intravenous Every 8 hours 11/26/17 0137     11/26/17 0200  vancomycin (VANCOCIN) 2,000 mg in sodium chloride 0.9 % 500 mL IVPB     2,000 mg 250 mL/hr over 120 Minutes Intravenous  Once 11/26/17 0137 11/26/17 0610   11/26/17 0200  ciprofloxacin (CIPRO) IVPB 400 mg     400 mg 200 mL/hr over 60 Minutes Intravenous 2 times daily 11/26/17 0137      .  Patient was given sequential compression devices, early ambulation, and Xarelto for DVT prophylaxis.  Recent vital signs:  Patient Vitals for the past 24 hrs:  BP Temp Temp src Pulse Resp SpO2  11/27/17 0500 (!) 141/75 98.2 F (36.8 C) Oral 75 16 98 %  11/26/17 2105 - - - - - 100 %  11/26/17 2015 (!) 150/80 98 F (36.7 C) Oral 70 16 100 %  11/26/17 1400 131/71 98 F (36.7 C) Oral 72 16 98 %  .  Recent laboratory studies: No results found.  Discharge Medications:   Allergies as of 11/27/2017      Reactions   Penicillins Anaphylaxis   Has patient had a PCN reaction causing immediate rash, facial/tongue/throat swelling, SOB or lightheadedness with hypotension: No Has patient had a PCN reaction causing severe rash involving mucus membranes or skin necrosis: No Has patient  had a PCN reaction that required hospitalization: No Has patient had a PCN reaction occurring within the last 10 years: No If all of the above answers are "NO", then may proceed with Cephalosporin use.   Grass Extracts [gramineae Pollens]    Other    Trees, pollen, mildew, cats, dogs       Medication List    TAKE these medications   ADVAIR DISKUS 100-50 MCG/DOSE Aepb Generic drug:  Fluticasone-Salmeterol Inhale 1 puff into the lungs at bedtime as needed.   albuterol 108 (90 Base) MCG/ACT inhaler Commonly known as:  PROVENTIL HFA;VENTOLIN HFA Inhale 1 puff into the lungs as needed for wheezing.   aspirin 81 MG chewable tablet Chew 1 tablet (81 mg total) by mouth 2 (two) times daily.   cetirizine 10 MG tablet Commonly known as:  ZYRTEC Take 10 mg by mouth at bedtime.   CLEAR EYES SEASONAL RELIEF 0.012-0.2-0.25 % Soln Generic drug:  Naphazoline-Glycerin-Zinc Sulf Apply 1 drop to eye 2 (two) times daily as needed.   DEPO-TESTOSTERONE 200 MG/ML injection Generic drug:  testosterone cypionate Inject 200 mg into the muscle See admin instructions. Every two weeks   DYMISTA 137-50 MCG/ACT Susp Generic drug:  Azelastine-Fluticasone Place 1 spray into both nostrils every morning.   methocarbamol 500 MG tablet Commonly known as:  ROBAXIN Take 1 tablet (500 mg total)  by mouth every 6 (six) hours as needed for muscle spasms.   oxyCODONE 5 MG immediate release tablet Commonly known as:  Oxy IR/ROXICODONE Take 1-2 tablets (5-10 mg total) by mouth every 4 (four) hours as needed for moderate pain (pain score 4-6).   pantoprazole 40 MG tablet Commonly known as:  PROTONIX Take 40 mg by mouth 2 (two) times daily.   sildenafil 20 MG tablet Commonly known as:  REVATIO Take 20 mg by mouth daily as needed.   VITAMIN D-1000 MAX ST 1000 units tablet Generic drug:  Cholecalciferol Take 1,000 Units by mouth daily.            Discharge Care Instructions  (From admission, onward)         Start     Ordered   11/27/17 0000  Weight bearing as tolerated     11/27/17 0700      Diagnostic Studies: Dg Knee Right Port  Result Date: 11/22/2017 CLINICAL DATA:  Right total knee arthroplasty EXAM: PORTABLE RIGHT KNEE - 1-2 VIEW COMPARISON:  None. FINDINGS: Status post right total knee arthroplasty with well-positioned right distal femoral, right proximal tibial and posterior patellar prostheses with no evidence of hardware fracture or loosening. No acute osseous fracture. No dislocation. No suspicious focal osseous lesions. Tiny superior right patellar enthesophyte. No significant joint effusion. Expected soft tissue gas surrounding the right knee joint anteriorly. IMPRESSION: Satisfactory immediate postoperative appearance status post right total knee arthroplasty. Electronically Signed   By: Ilona Sorrel M.D.   On: 11/22/2017 10:12    Patient benefited maximally from their hospital stay and there were no complications.     Disposition: Discharge disposition: 01-Home or Self Care      Discharge Instructions    Call MD / Call 911   Complete by:  As directed    If you experience chest pain or shortness of breath, CALL 911 and be transported to the hospital emergency room.  If you develope a fever above 101 F, pus (white drainage) or increased drainage or redness at the wound, or calf pain, call your surgeon's office.   Constipation Prevention   Complete by:  As directed    Drink plenty of fluids.  Prune juice may be helpful.  You may use a stool softener, such as Colace (over the counter) 100 mg twice a day.  Use MiraLax (over the counter) for constipation as needed.   Diet - low sodium heart healthy   Complete by:  As directed    Elevate operative extremity   Complete by:  As directed    Increase activity slowly as tolerated   Complete by:  As directed    Weight bearing as tolerated   Complete by:  As directed      Follow-up Information    Mcarthur Rossetti, MD  Follow up in 1 week(s).   Specialty:  Orthopedic Surgery Contact information: [place] [address] [phone]            Signed: Newt Minion 11/27/2017, 7:00 AM

## 2017-11-28 DIAGNOSIS — Z7951 Long term (current) use of inhaled steroids: Secondary | ICD-10-CM | POA: Diagnosis not present

## 2017-11-28 DIAGNOSIS — T8149XA Infection following a procedure, other surgical site, initial encounter: Secondary | ICD-10-CM | POA: Diagnosis not present

## 2017-11-28 DIAGNOSIS — Z96651 Presence of right artificial knee joint: Secondary | ICD-10-CM | POA: Diagnosis not present

## 2017-11-28 DIAGNOSIS — L03115 Cellulitis of right lower limb: Secondary | ICD-10-CM | POA: Diagnosis not present

## 2017-11-28 DIAGNOSIS — Z9181 History of falling: Secondary | ICD-10-CM | POA: Diagnosis not present

## 2017-11-28 DIAGNOSIS — J45909 Unspecified asthma, uncomplicated: Secondary | ICD-10-CM | POA: Diagnosis not present

## 2017-11-28 DIAGNOSIS — Z79891 Long term (current) use of opiate analgesic: Secondary | ICD-10-CM | POA: Diagnosis not present

## 2017-11-28 DIAGNOSIS — K219 Gastro-esophageal reflux disease without esophagitis: Secondary | ICD-10-CM | POA: Diagnosis not present

## 2017-11-29 ENCOUNTER — Telehealth (INDEPENDENT_AMBULATORY_CARE_PROVIDER_SITE_OTHER): Payer: Self-pay | Admitting: Orthopaedic Surgery

## 2017-11-29 ENCOUNTER — Encounter (INDEPENDENT_AMBULATORY_CARE_PROVIDER_SITE_OTHER): Payer: Self-pay | Admitting: Physician Assistant

## 2017-11-29 ENCOUNTER — Encounter (INDEPENDENT_AMBULATORY_CARE_PROVIDER_SITE_OTHER): Payer: Self-pay | Admitting: Orthopaedic Surgery

## 2017-11-29 ENCOUNTER — Other Ambulatory Visit (INDEPENDENT_AMBULATORY_CARE_PROVIDER_SITE_OTHER): Payer: Self-pay

## 2017-11-29 DIAGNOSIS — T8149XA Infection following a procedure, other surgical site, initial encounter: Secondary | ICD-10-CM | POA: Diagnosis not present

## 2017-11-29 DIAGNOSIS — K219 Gastro-esophageal reflux disease without esophagitis: Secondary | ICD-10-CM | POA: Diagnosis not present

## 2017-11-29 DIAGNOSIS — Z79891 Long term (current) use of opiate analgesic: Secondary | ICD-10-CM | POA: Diagnosis not present

## 2017-11-29 DIAGNOSIS — Z7951 Long term (current) use of inhaled steroids: Secondary | ICD-10-CM | POA: Diagnosis not present

## 2017-11-29 DIAGNOSIS — Z9181 History of falling: Secondary | ICD-10-CM | POA: Diagnosis not present

## 2017-11-29 DIAGNOSIS — L03115 Cellulitis of right lower limb: Secondary | ICD-10-CM | POA: Diagnosis not present

## 2017-11-29 DIAGNOSIS — Z96651 Presence of right artificial knee joint: Secondary | ICD-10-CM | POA: Diagnosis not present

## 2017-11-29 DIAGNOSIS — J45909 Unspecified asthma, uncomplicated: Secondary | ICD-10-CM | POA: Diagnosis not present

## 2017-11-29 MED ORDER — OXYCODONE HCL 5 MG PO TABS: 5.0000 mg | ORAL_TABLET | ORAL | 0 refills | Status: DC | PRN

## 2017-11-29 NOTE — Telephone Encounter (Signed)
Patient called needing Rx refilled ( Oxycodone 5 mg) The number to contact patient is [phone]

## 2017-12-02 ENCOUNTER — Other Ambulatory Visit (INDEPENDENT_AMBULATORY_CARE_PROVIDER_SITE_OTHER): Payer: Self-pay | Admitting: Orthopaedic Surgery

## 2017-12-02 DIAGNOSIS — Z7951 Long term (current) use of inhaled steroids: Secondary | ICD-10-CM | POA: Diagnosis not present

## 2017-12-02 DIAGNOSIS — T8149XA Infection following a procedure, other surgical site, initial encounter: Secondary | ICD-10-CM | POA: Diagnosis not present

## 2017-12-02 DIAGNOSIS — Z79891 Long term (current) use of opiate analgesic: Secondary | ICD-10-CM | POA: Diagnosis not present

## 2017-12-02 DIAGNOSIS — L03115 Cellulitis of right lower limb: Secondary | ICD-10-CM | POA: Diagnosis not present

## 2017-12-02 DIAGNOSIS — Z9181 History of falling: Secondary | ICD-10-CM | POA: Diagnosis not present

## 2017-12-02 DIAGNOSIS — J45909 Unspecified asthma, uncomplicated: Secondary | ICD-10-CM | POA: Diagnosis not present

## 2017-12-02 DIAGNOSIS — K219 Gastro-esophageal reflux disease without esophagitis: Secondary | ICD-10-CM | POA: Diagnosis not present

## 2017-12-02 DIAGNOSIS — Z96651 Presence of right artificial knee joint: Secondary | ICD-10-CM | POA: Diagnosis not present

## 2017-12-02 MED ORDER — OXYCODONE HCL 5 MG PO TABS: 5.0000 mg | ORAL_TABLET | ORAL | 0 refills | Status: DC | PRN

## 2017-12-02 NOTE — Telephone Encounter (Signed)
I escribed in some this morning.

## 2017-12-04 DIAGNOSIS — L03115 Cellulitis of right lower limb: Secondary | ICD-10-CM | POA: Diagnosis not present

## 2017-12-04 DIAGNOSIS — Z79891 Long term (current) use of opiate analgesic: Secondary | ICD-10-CM | POA: Diagnosis not present

## 2017-12-04 DIAGNOSIS — Z96651 Presence of right artificial knee joint: Secondary | ICD-10-CM | POA: Diagnosis not present

## 2017-12-04 DIAGNOSIS — Z7951 Long term (current) use of inhaled steroids: Secondary | ICD-10-CM | POA: Diagnosis not present

## 2017-12-04 DIAGNOSIS — K219 Gastro-esophageal reflux disease without esophagitis: Secondary | ICD-10-CM | POA: Diagnosis not present

## 2017-12-04 DIAGNOSIS — J45909 Unspecified asthma, uncomplicated: Secondary | ICD-10-CM | POA: Diagnosis not present

## 2017-12-04 DIAGNOSIS — Z9181 History of falling: Secondary | ICD-10-CM | POA: Diagnosis not present

## 2017-12-04 DIAGNOSIS — T8149XA Infection following a procedure, other surgical site, initial encounter: Secondary | ICD-10-CM | POA: Diagnosis not present

## 2017-12-06 ENCOUNTER — Telehealth (INDEPENDENT_AMBULATORY_CARE_PROVIDER_SITE_OTHER): Payer: Self-pay | Admitting: Orthopaedic Surgery

## 2017-12-06 ENCOUNTER — Other Ambulatory Visit (INDEPENDENT_AMBULATORY_CARE_PROVIDER_SITE_OTHER): Payer: Self-pay | Admitting: Orthopaedic Surgery

## 2017-12-06 DIAGNOSIS — K219 Gastro-esophageal reflux disease without esophagitis: Secondary | ICD-10-CM | POA: Diagnosis not present

## 2017-12-06 DIAGNOSIS — L03115 Cellulitis of right lower limb: Secondary | ICD-10-CM | POA: Diagnosis not present

## 2017-12-06 DIAGNOSIS — Z79891 Long term (current) use of opiate analgesic: Secondary | ICD-10-CM | POA: Diagnosis not present

## 2017-12-06 DIAGNOSIS — J45909 Unspecified asthma, uncomplicated: Secondary | ICD-10-CM | POA: Diagnosis not present

## 2017-12-06 DIAGNOSIS — Z7951 Long term (current) use of inhaled steroids: Secondary | ICD-10-CM | POA: Diagnosis not present

## 2017-12-06 DIAGNOSIS — Z9181 History of falling: Secondary | ICD-10-CM | POA: Diagnosis not present

## 2017-12-06 DIAGNOSIS — T8149XA Infection following a procedure, other surgical site, initial encounter: Secondary | ICD-10-CM | POA: Diagnosis not present

## 2017-12-06 DIAGNOSIS — Z96651 Presence of right artificial knee joint: Secondary | ICD-10-CM | POA: Diagnosis not present

## 2017-12-06 MED ORDER — OXYCODONE HCL 5 MG PO TABS: 5.0000 mg | ORAL_TABLET | ORAL | 0 refills | Status: DC | PRN

## 2017-12-06 NOTE — Telephone Encounter (Signed)
Please advise 

## 2017-12-06 NOTE — Telephone Encounter (Signed)
Patient called asking for a refill on his oxycodone. CB # [phone]

## 2017-12-06 NOTE — Telephone Encounter (Signed)
IC advised done 

## 2017-12-06 NOTE — Telephone Encounter (Signed)
I sent some in 

## 2017-12-09 ENCOUNTER — Encounter (INDEPENDENT_AMBULATORY_CARE_PROVIDER_SITE_OTHER): Payer: Self-pay | Admitting: Orthopaedic Surgery

## 2017-12-09 ENCOUNTER — Ambulatory Visit (INDEPENDENT_AMBULATORY_CARE_PROVIDER_SITE_OTHER): Payer: BLUE CROSS/BLUE SHIELD | Admitting: Orthopaedic Surgery

## 2017-12-09 DIAGNOSIS — Z96651 Presence of right artificial knee joint: Secondary | ICD-10-CM | POA: Insufficient documentation

## 2017-12-09 MED ORDER — METHOCARBAMOL 500 MG PO TABS: 500.0000 mg | ORAL_TABLET | Freq: Four times a day (QID) | ORAL | 0 refills | Status: DC | PRN

## 2017-12-09 NOTE — Progress Notes (Signed)
The patient is 17 days status post a right total knee arthroplasty.  He was admitted few days after being discharged with right leg redness from his calf was very swollen up to his groin area.  After 2 days of IV antibiotics this apparently resolved.  Has been on oral antibiotics as well.  A DVT screen was negative via ultrasound.  He is doing well with home health therapy.  Notes from therapies he lacks full extension by about 4 degrees but is already flex to 90 degrees.  On examination there is no redness at all.  His calf is soft.  There is no drainage from his right knee incision I placed new Steri-Strips.  He lacks full extension by a few degrees and flexion is 90 degrees.  He can stop the antibiotics from my standpoint.  I will continue to call in narcotic pain  medication for him to get him through outpatient therapy.  He starts outpatient therapy tomorrow already.  I gave him prescription for this as well.  I will refill his muscle relaxant as well.  See him back in a month to see how is doing from mobility standpoint.

## 2017-12-10 DIAGNOSIS — M1711 Unilateral primary osteoarthritis, right knee: Secondary | ICD-10-CM | POA: Diagnosis not present

## 2017-12-12 ENCOUNTER — Encounter (INDEPENDENT_AMBULATORY_CARE_PROVIDER_SITE_OTHER): Payer: Self-pay

## 2017-12-12 ENCOUNTER — Other Ambulatory Visit (INDEPENDENT_AMBULATORY_CARE_PROVIDER_SITE_OTHER): Payer: Self-pay | Admitting: Orthopaedic Surgery

## 2017-12-12 DIAGNOSIS — M1711 Unilateral primary osteoarthritis, right knee: Secondary | ICD-10-CM | POA: Diagnosis not present

## 2017-12-12 MED ORDER — OXYCODONE HCL 5 MG PO TABS: 5.0000 mg | ORAL_TABLET | ORAL | 0 refills | Status: DC | PRN

## 2017-12-12 NOTE — Telephone Encounter (Signed)
Please advise 

## 2017-12-12 NOTE — Telephone Encounter (Signed)
[place] patient 

## 2017-12-16 DIAGNOSIS — M1711 Unilateral primary osteoarthritis, right knee: Secondary | ICD-10-CM | POA: Diagnosis not present

## 2017-12-18 DIAGNOSIS — M1711 Unilateral primary osteoarthritis, right knee: Secondary | ICD-10-CM | POA: Diagnosis not present

## 2017-12-23 ENCOUNTER — Encounter (INDEPENDENT_AMBULATORY_CARE_PROVIDER_SITE_OTHER): Payer: Self-pay | Admitting: Orthopaedic Surgery

## 2017-12-23 ENCOUNTER — Other Ambulatory Visit (INDEPENDENT_AMBULATORY_CARE_PROVIDER_SITE_OTHER): Payer: Self-pay | Admitting: Orthopaedic Surgery

## 2017-12-23 MED ORDER — OXYCODONE HCL 5 MG PO TABS: 5.0000 mg | ORAL_TABLET | ORAL | 0 refills | Status: DC | PRN

## 2017-12-23 NOTE — Telephone Encounter (Signed)
Please advise 

## 2017-12-24 ENCOUNTER — Other Ambulatory Visit (INDEPENDENT_AMBULATORY_CARE_PROVIDER_SITE_OTHER): Payer: Self-pay | Admitting: Orthopaedic Surgery

## 2017-12-24 DIAGNOSIS — M1711 Unilateral primary osteoarthritis, right knee: Secondary | ICD-10-CM | POA: Diagnosis not present

## 2017-12-24 MED ORDER — METHOCARBAMOL 500 MG PO TABS: 500.0000 mg | ORAL_TABLET | Freq: Four times a day (QID) | ORAL | 0 refills | Status: DC | PRN

## 2017-12-24 NOTE — Telephone Encounter (Signed)
[place] patient 

## 2017-12-26 DIAGNOSIS — M1711 Unilateral primary osteoarthritis, right knee: Secondary | ICD-10-CM | POA: Diagnosis not present

## 2017-12-31 DIAGNOSIS — M1711 Unilateral primary osteoarthritis, right knee: Secondary | ICD-10-CM | POA: Diagnosis not present

## 2018-01-02 DIAGNOSIS — M1711 Unilateral primary osteoarthritis, right knee: Secondary | ICD-10-CM | POA: Diagnosis not present

## 2018-01-07 DIAGNOSIS — M1711 Unilateral primary osteoarthritis, right knee: Secondary | ICD-10-CM | POA: Diagnosis not present

## 2018-01-08 ENCOUNTER — Encounter (INDEPENDENT_AMBULATORY_CARE_PROVIDER_SITE_OTHER): Payer: Self-pay | Admitting: Orthopaedic Surgery

## 2018-01-08 ENCOUNTER — Ambulatory Visit (INDEPENDENT_AMBULATORY_CARE_PROVIDER_SITE_OTHER): Payer: BLUE CROSS/BLUE SHIELD | Admitting: Orthopaedic Surgery

## 2018-01-08 ENCOUNTER — Other Ambulatory Visit (INDEPENDENT_AMBULATORY_CARE_PROVIDER_SITE_OTHER): Payer: Self-pay | Admitting: Orthopaedic Surgery

## 2018-01-08 DIAGNOSIS — Z96651 Presence of right artificial knee joint: Secondary | ICD-10-CM

## 2018-01-08 MED ORDER — OXYCODONE HCL 5 MG PO TABS: 5.0000 mg | ORAL_TABLET | ORAL | 0 refills | Status: DC | PRN

## 2018-01-08 MED ORDER — HYDROCODONE-ACETAMINOPHEN 5-325 MG PO TABS: 1.0000 | ORAL_TABLET | Freq: Four times a day (QID) | ORAL | 0 refills | Status: DC | PRN

## 2018-01-08 NOTE — Progress Notes (Signed)
The patient is now 6 weeks status post a right total knee arthroplasty.  He is making excellent progress with therapy.  He feels like he is ready to drive.  Is been weaning from pain medication as well but he still needing it at night.  Is working on home exercises as well as going to outpatient therapy.  On exam his extension is almost full in his right knee and his flexion is about 110 degrees today.  The knee feels loosely stable.  There is moderate swelling to be expected and the incisions well-healed.  At this point he can drive as long is not taking narcotics during the daytime.  I did refill his oxycodone but he is only needed over these next few weeks.  To increase his activities as comfort allows.  I would like to see him back in 4 weeks to see how is doing overall but no x-rays are needed.

## 2018-01-09 ENCOUNTER — Encounter (INDEPENDENT_AMBULATORY_CARE_PROVIDER_SITE_OTHER): Payer: Self-pay | Admitting: Orthopaedic Surgery

## 2018-01-10 DIAGNOSIS — M1711 Unilateral primary osteoarthritis, right knee: Secondary | ICD-10-CM | POA: Diagnosis not present

## 2018-01-13 DIAGNOSIS — M1711 Unilateral primary osteoarthritis, right knee: Secondary | ICD-10-CM | POA: Diagnosis not present

## 2018-01-15 DIAGNOSIS — M1711 Unilateral primary osteoarthritis, right knee: Secondary | ICD-10-CM | POA: Diagnosis not present

## 2018-01-21 DIAGNOSIS — M1711 Unilateral primary osteoarthritis, right knee: Secondary | ICD-10-CM | POA: Diagnosis not present

## 2018-01-23 DIAGNOSIS — M1711 Unilateral primary osteoarthritis, right knee: Secondary | ICD-10-CM | POA: Diagnosis not present

## 2018-01-28 DIAGNOSIS — M1711 Unilateral primary osteoarthritis, right knee: Secondary | ICD-10-CM | POA: Diagnosis not present

## 2018-01-31 DIAGNOSIS — M1711 Unilateral primary osteoarthritis, right knee: Secondary | ICD-10-CM | POA: Diagnosis not present

## 2018-02-05 ENCOUNTER — Ambulatory Visit (INDEPENDENT_AMBULATORY_CARE_PROVIDER_SITE_OTHER): Payer: BLUE CROSS/BLUE SHIELD | Admitting: Orthopaedic Surgery

## 2018-02-05 ENCOUNTER — Encounter (INDEPENDENT_AMBULATORY_CARE_PROVIDER_SITE_OTHER): Payer: Self-pay | Admitting: Orthopaedic Surgery

## 2018-02-05 DIAGNOSIS — Z96651 Presence of right artificial knee joint: Secondary | ICD-10-CM

## 2018-02-05 NOTE — Progress Notes (Signed)
The patient is now 75 days status post a right total knee arthroplasty.  He says he is doing great and has just a slight limp.  He does have some pain at night but is just taken Tylenol PM.  He is pleased overall.  He also has noted some physical therapy and they have discharged him.  He is getting his flexion to already 120 degrees.  On exam he is still knee swelling to be expected but no effusion.  His incisions healed nicely.  The knee feels ligaments is stable on the right side.  His range of motion is full.  At this point he is released to full activities as comfort allows.  We will see him back in 6 months with a repeat AP and lateral of the right knee.  All question concerns were answered and addressed.

## 2018-03-11 DIAGNOSIS — J310 Chronic rhinitis: Secondary | ICD-10-CM | POA: Diagnosis not present

## 2018-03-11 DIAGNOSIS — J324 Chronic pansinusitis: Secondary | ICD-10-CM | POA: Diagnosis not present

## 2018-03-17 ENCOUNTER — Encounter (INDEPENDENT_AMBULATORY_CARE_PROVIDER_SITE_OTHER): Payer: Self-pay | Admitting: Orthopaedic Surgery

## 2018-04-04 ENCOUNTER — Other Ambulatory Visit: Payer: Self-pay | Admitting: Internal Medicine

## 2018-04-04 NOTE — Telephone Encounter (Signed)
Left patient message to call back to confirm dosage of his PPI.

## 2018-04-07 NOTE — Telephone Encounter (Signed)
I spoke with Marcus Watson and he said he mostly just takes one a day of his pantoprazole. Occasionally takes it twice daily. Will send in his refill.

## 2018-04-18 DIAGNOSIS — H1012 Acute atopic conjunctivitis, left eye: Secondary | ICD-10-CM | POA: Diagnosis not present

## 2018-04-18 DIAGNOSIS — R109 Unspecified abdominal pain: Secondary | ICD-10-CM | POA: Diagnosis not present

## 2018-04-21 ENCOUNTER — Other Ambulatory Visit: Payer: Self-pay | Admitting: Family Medicine

## 2018-04-21 DIAGNOSIS — R109 Unspecified abdominal pain: Secondary | ICD-10-CM

## 2018-04-28 ENCOUNTER — Ambulatory Visit
Admission: RE | Admit: 2018-04-28 | Discharge: 2018-04-28 | Disposition: A | Discharge status: Home / Self Care | Payer: BLUE CROSS/BLUE SHIELD | Source: Ambulatory Visit | Attending: Family Medicine | Admitting: Family Medicine

## 2018-04-28 DIAGNOSIS — I77811 Abdominal aortic ectasia: Secondary | ICD-10-CM | POA: Diagnosis not present

## 2018-04-28 DIAGNOSIS — R109 Unspecified abdominal pain: Secondary | ICD-10-CM

## 2018-06-20 DIAGNOSIS — F411 Generalized anxiety disorder: Secondary | ICD-10-CM | POA: Diagnosis not present

## 2018-06-23 DIAGNOSIS — J4530 Mild persistent asthma, uncomplicated: Secondary | ICD-10-CM | POA: Diagnosis not present

## 2018-06-23 DIAGNOSIS — J309 Allergic rhinitis, unspecified: Secondary | ICD-10-CM | POA: Diagnosis not present

## 2018-06-23 DIAGNOSIS — H1013 Acute atopic conjunctivitis, bilateral: Secondary | ICD-10-CM | POA: Diagnosis not present

## 2018-06-24 DIAGNOSIS — F411 Generalized anxiety disorder: Secondary | ICD-10-CM | POA: Diagnosis not present

## 2018-07-01 DIAGNOSIS — F411 Generalized anxiety disorder: Secondary | ICD-10-CM | POA: Diagnosis not present

## 2018-07-04 DIAGNOSIS — F411 Generalized anxiety disorder: Secondary | ICD-10-CM | POA: Diagnosis not present

## 2018-07-08 DIAGNOSIS — F411 Generalized anxiety disorder: Secondary | ICD-10-CM | POA: Diagnosis not present

## 2018-07-09 DIAGNOSIS — F411 Generalized anxiety disorder: Secondary | ICD-10-CM | POA: Diagnosis not present

## 2018-07-22 DIAGNOSIS — R7989 Other specified abnormal findings of blood chemistry: Secondary | ICD-10-CM | POA: Diagnosis not present

## 2018-07-22 DIAGNOSIS — F411 Generalized anxiety disorder: Secondary | ICD-10-CM | POA: Diagnosis not present

## 2018-07-22 DIAGNOSIS — N529 Male erectile dysfunction, unspecified: Secondary | ICD-10-CM | POA: Diagnosis not present

## 2018-07-22 DIAGNOSIS — N401 Enlarged prostate with lower urinary tract symptoms: Secondary | ICD-10-CM | POA: Diagnosis not present

## 2018-07-22 DIAGNOSIS — R351 Nocturia: Secondary | ICD-10-CM | POA: Diagnosis not present

## 2018-07-22 DIAGNOSIS — N400 Enlarged prostate without lower urinary tract symptoms: Secondary | ICD-10-CM | POA: Diagnosis not present

## 2018-07-23 DIAGNOSIS — F411 Generalized anxiety disorder: Secondary | ICD-10-CM | POA: Diagnosis not present

## 2018-07-31 DIAGNOSIS — F411 Generalized anxiety disorder: Secondary | ICD-10-CM | POA: Diagnosis not present

## 2018-08-11 ENCOUNTER — Ambulatory Visit (INDEPENDENT_AMBULATORY_CARE_PROVIDER_SITE_OTHER): Payer: BLUE CROSS/BLUE SHIELD | Admitting: Orthopaedic Surgery

## 2018-08-19 ENCOUNTER — Encounter (HOSPITAL_BASED_OUTPATIENT_CLINIC_OR_DEPARTMENT_OTHER): Payer: Self-pay

## 2018-08-19 ENCOUNTER — Other Ambulatory Visit: Payer: Self-pay

## 2018-08-19 ENCOUNTER — Emergency Department (HOSPITAL_BASED_OUTPATIENT_CLINIC_OR_DEPARTMENT_OTHER)
Admission: EM | Admit: 2018-08-19 | Discharge: 2018-08-19 | Disposition: A | Discharge status: Home / Self Care | Payer: BLUE CROSS/BLUE SHIELD | Attending: Emergency Medicine | Admitting: Emergency Medicine

## 2018-08-19 DIAGNOSIS — Z96651 Presence of right artificial knee joint: Secondary | ICD-10-CM | POA: Diagnosis not present

## 2018-08-19 DIAGNOSIS — R197 Diarrhea, unspecified: Secondary | ICD-10-CM | POA: Diagnosis not present

## 2018-08-19 DIAGNOSIS — Z79899 Other long term (current) drug therapy: Secondary | ICD-10-CM | POA: Insufficient documentation

## 2018-08-19 DIAGNOSIS — F411 Generalized anxiety disorder: Secondary | ICD-10-CM | POA: Diagnosis not present

## 2018-08-19 DIAGNOSIS — R109 Unspecified abdominal pain: Secondary | ICD-10-CM | POA: Diagnosis not present

## 2018-08-19 LAB — CBC WITH DIFFERENTIAL/PLATELET
Abs Immature Granulocytes: 0.07 10*3/uL (ref 0.00–0.07)
Basophils Absolute: 0.1 10*3/uL (ref 0.0–0.1)
Basophils Relative: 0 %
Eosinophils Absolute: 0.3 10*3/uL (ref 0.0–0.5)
Eosinophils Relative: 2 %
HCT: 49.5 % (ref 39.0–52.0)
Hemoglobin: 16.2 g/dL (ref 13.0–17.0)
Immature Granulocytes: 1 %
Lymphocytes Relative: 9 %
Lymphs Abs: 1.3 10*3/uL (ref 0.7–4.0)
MCH: 30.1 pg (ref 26.0–34.0)
MCHC: 32.7 g/dL (ref 30.0–36.0)
MCV: 92 fL (ref 80.0–100.0)
Monocytes Absolute: 1.1 10*3/uL — ABNORMAL HIGH (ref 0.1–1.0)
Monocytes Relative: 8 %
Neutro Abs: 11 10*3/uL — ABNORMAL HIGH (ref 1.7–7.7)
Neutrophils Relative %: 80 %
Platelets: 194 10*3/uL (ref 150–400)
RBC: 5.38 MIL/uL (ref 4.22–5.81)
RDW: 12.9 % (ref 11.5–15.5)
WBC: 13.9 10*3/uL — ABNORMAL HIGH (ref 4.0–10.5)
nRBC: 0 % (ref 0.0–0.2)

## 2018-08-19 LAB — COMPREHENSIVE METABOLIC PANEL
ALT: 21 U/L (ref 0–44)
AST: 20 U/L (ref 15–41)
Albumin: 4.2 g/dL (ref 3.5–5.0)
Alkaline Phosphatase: 43 U/L (ref 38–126)
Anion gap: 6 (ref 5–15)
BUN: 9 mg/dL (ref 6–20)
CO2: 25 mmol/L (ref 22–32)
Calcium: 8.4 mg/dL — ABNORMAL LOW (ref 8.9–10.3)
Chloride: 102 mmol/L (ref 98–111)
Creatinine, Ser: 1.12 mg/dL (ref 0.61–1.24)
GFR calc Af Amer: >60 mL/min (ref >60)
GFR calc non Af Amer: >60 mL/min (ref >60)
Glucose, Bld: 97 mg/dL (ref 70–99)
Potassium: 3.6 mmol/L (ref 3.5–5.1)
Sodium: 133 mmol/L — ABNORMAL LOW (ref 135–145)
Total Bilirubin: 1.7 mg/dL — ABNORMAL HIGH (ref 0.3–1.2)
Total Protein: 7 g/dL (ref 6.5–8.1)

## 2018-08-19 LAB — LIPASE, BLOOD: Lipase: 24 U/L (ref 11–51)

## 2018-08-19 MED ORDER — SODIUM CHLORIDE 0.9 % IV BOLUS
1000.0000 mL | Freq: Once | INTRAVENOUS | Status: AC
  Administered 2018-08-19: 1000 mL via INTRAVENOUS

## 2018-08-19 NOTE — ED Provider Notes (Signed)
[facility]ERGENCY DEPARTMENT Provider Note   CSN: [id number] Arrival date & time: 08/19/18  1318    History   Chief Complaint Chief Complaint  Patient presents with  . Diarrhea    HPI Marcanthony Sleight is a 58 y.o. male.     HPI  58 year old male presents with a four-day history of diarrhea.  Patient states he recently traveled back from [place] 4 days ago.  He then started having diarrhea which has started to slow down in frequency.  He states only one episode in the last 12 hours.  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  He describes diarrhea as watery and green in color.  He states some mild periumbilical abdominal pain earlier today when he drinks some water but denies any abdominal pain currently patient denies any associated fevers, chills, nausea, vomiting.  He denies any hematemesis, hematochezia, melena.  He states he tried to be careful about what he ate and drink, he did not drink tap water and denies eating any raw vegetables.  Past Medical History:  Diagnosis Date  . Allergy   . Eosinophilic esophagitis 07/23/5807  . Erectile dysfunction   . Esophageal laceration 12/04/2016  . Food impaction of esophagus 12/04/2016  . GERD with stricture   . Melanoma ([place])    back  . Mild asthma   . Seasonal allergies   . Testosterone deficiency     Patient Active Problem List   Diagnosis Date Noted  . Status post total right knee replacement 12/09/2017  . Cellulitis of right knee 11/26/2017  . Status post arthroscopy of right knee 07/10/2017  . Unilateral primary osteoarthritis, right knee 07/10/2017  . GERD with stricture 03/19/2017  . Pain in right hip 03/04/2017  . Acute pain of right knee 03/04/2017  . Trochanteric bursitis, right hip 03/04/2017  . Eosinophilic esophagitis 98/33/8250    Past Surgical History:  Procedure Laterality Date  . COLONOSCOPY  2015   in [place]  .  ESOPHAGOGASTRODUODENOSCOPY N/A 12/04/2016   Procedure: ESOPHAGOGASTRODUODENOSCOPY (EGD);  Surgeon: [name], MD;  Location: [facility] ENDOSCOPY;  Service: Endoscopy;  Laterality: N/A;  . HERNIA REPAIR    . KNEE ARTHROCENTESIS    . KNEE ARTHROSCOPY Right   . NASAL SINUS SURGERY    . TONSILLECTOMY    . TOTAL KNEE ARTHROPLASTY Right 11/22/2017   Procedure: RIGHT TOTAL KNEE ARTHROPLASTY;  Surgeon: Mcarthur Rossetti, MD;  Location: [facility] ORS;  Service: Orthopedics;  Laterality: Right;  Adductor Block        Home Medications    Prior to Admission medications   Medication Sig Start Date End Date Taking? Authorizing Provider  albuterol (PROVENTIL HFA;VENTOLIN HFA) 108 (90 Base) MCG/ACT inhaler Inhale 1 puff into the lungs as needed for wheezing.  12/28/15 11/26/17  [provider]  aspirin 81 MG chewable tablet Chew 1 tablet (81 mg total) by mouth 2 (two) times daily. 11/23/17   Mcarthur Rossetti, MD  cetirizine (ZYRTEC) 10 MG tablet Take 10 mg by mouth at bedtime.     [provider]  Cholecalciferol (VITAMIN D-1000 MAX ST) 1000 units tablet Take 1,000 Units by mouth daily.    [provider]  doxycycline (VIBRA-TABS) 100 MG tablet Take 1 tablet (100 mg total) by mouth 2 (two) times daily. 11/27/17   Newt Minion, MD  DYMISTA [phone] MCG/ACT SUSP Place 1 spray into both nostrils every morning.  11/30/16   [provider]  Fluticasone-Salmeterol (ADVAIR DISKUS) 100-50 MCG/DOSE AEPB Inhale 1 puff into the lungs at bedtime as needed.    [provider]  HYDROcodone-acetaminophen (NORCO/VICODIN) 5-325 MG tablet Take 1-2 tablets by mouth every 6 (six) hours as needed for moderate pain. 01/08/18   Mcarthur Rossetti, MD  methocarbamol (ROBAXIN) 500 MG tablet Take 1 tablet (500 mg total) by mouth every 6 (six) hours as needed for muscle spasms. 12/24/17   Mcarthur Rossetti, MD  Naphazoline-Glycerin-Zinc Sulf (CLEAR EYES SEASONAL RELIEF)  0.012-0.2-0.25 % SOLN Apply 1 drop to eye 2 (two) times daily as needed.    [provider]  oxyCODONE (OXY IR/ROXICODONE) 5 MG immediate release tablet Take 1-2 tablets (5-10 mg total) by mouth every 4 (four) hours as needed for moderate pain (pain score 4-6). 01/08/18   Mcarthur Rossetti, MD  pantoprazole (PROTONIX) 40 MG tablet Take 40 mg by mouth 2 (two) times daily.  07/24/17   [name], MD  pantoprazole (PROTONIX) 40 MG tablet TAKE 1 TABLET DAILY 04/07/18   [name], MD  sildenafil (REVATIO) 20 MG tablet Take 20 mg by mouth daily as needed.  11/01/16   [provider]  testosterone cypionate (DEPO-TESTOSTERONE) 200 MG/ML injection Inject 200 mg into the muscle See admin instructions. Every two weeks 09/24/16   [provider]    Family History Family History  Problem Relation Age of Onset  . Hypertension Other   . Colon cancer Neg Hx   . Esophageal cancer Neg Hx   . Rectal cancer Neg Hx   . Stomach cancer Neg Hx     Social History Social History   Tobacco Use  . Smoking status: Never Smoker  . Smokeless tobacco: Former Systems developer    Types: Snuff  Substance Use Topics  . Alcohol use: Yes    Comment: occassional  . Drug use: No     Allergies   Penicillins; Grass extracts [gramineae pollens]; and Other   Review of Systems Review of Systems  Constitutional: Negative for chills and fever.  HENT: Negative for ear pain and sore throat.   Respiratory: Negative for shortness of breath.   Cardiovascular: Negative for chest pain.  Gastrointestinal: Positive for abdominal pain and diarrhea. Negative for blood in stool, nausea and vomiting.     Physical Exam Updated Vital Signs BP (!) 140/91 (BP Location: Left Arm)   Pulse 84   Temp 98.3 F (36.8 C) (Oral)   Resp 18   Ht 6\' 2"  (1.88 m)   Wt 103.4 kg   SpO2 100%   BMI 29.27 kg/m   Physical Exam Vitals signs and nursing note reviewed.  Constitutional:      Appearance: He is  well-developed.  HENT:     Head: Normocephalic and atraumatic.  Eyes:     Conjunctiva/sclera: Conjunctivae normal.  Neck:     Musculoskeletal: Neck supple.  Cardiovascular:     Rate and Rhythm: Normal rate and regular rhythm.     Heart sounds: Normal heart sounds. No murmur.  Pulmonary:     Effort: Pulmonary effort is normal. No respiratory distress.     Breath sounds: Normal breath sounds. No wheezing or rales.  Abdominal:     General: Bowel sounds are normal. There is no distension.     Palpations: Abdomen is soft.     Tenderness: There is no abdominal tenderness. Negative signs include Murphy's sign and McBurney's sign.  Musculoskeletal: Normal range of motion.  General: No tenderness or deformity.  Skin:    General: Skin is warm and dry.     Findings: No erythema or rash.  Neurological:     Mental Status: He is alert and oriented to person, place, and time.  Psychiatric:        Behavior: Behavior normal.      ED Treatments / Results  Labs (all labs ordered are listed, but only abnormal results are displayed) Labs Reviewed  CBC WITH DIFFERENTIAL/PLATELET - Abnormal; Notable for the following components:      Result Value   WBC 13.9 (*)    Neutro Abs 11.0 (*)    Monocytes Absolute 1.1 (*)    All other components within normal limits  COMPREHENSIVE METABOLIC PANEL - Abnormal; Notable for the following components:   Sodium 133 (*)    Calcium 8.4 (*)    Total Bilirubin 1.7 (*)    All other components within normal limits  LIPASE, BLOOD    EKG None  Radiology No results found.  Procedures Procedures (including critical care time)  Medications Ordered in ED Medications  sodium chloride 0.9 % bolus 1,000 mL (1,000 mLs Intravenous New Bag/Given 08/19/18 1512)     Initial Impression / Assessment and Plan / ED Course  I have reviewed the triage vital signs and the nursing notes.  Pertinent labs & imaging results that were available during my care of the  patient were reviewed by me and considered in my medical decision making (see chart for details).        Patient presented with a 4-day history of diarrhea.  He had noted some mild abdominal pain earlier today but has not had any pain since.  His abdomen is soft and nontender to palpation, no evidence of acute abdomen.  Patient p.o. tolerant to ginger ale in the ED.  His blood work is shows a slightly elevated white count, otherwise unremarkable.  Vital signs stable.  Patient did produce one stool while in the ED.  It was not liquid, so C. difficile was not sent.  However the rest of the GI panel was sent.  Encourage patient to continue to drink plenty of fluids.  Given strict return precautions.  Will let GI panel guide treatment.  Patient agreeable with plan.  He is ready and stable for discharge.  Final Clinical Impressions(s) / ED Diagnoses   Final diagnoses:  None    ED Discharge Orders    None       Rachel Moulds 08/20/18 8887    Veryl Speak, MD 08/20/18 1505

## 2018-08-19 NOTE — Discharge Instructions (Signed)
Your stool today was sent for testing.  Depending on these test results, additional treatment might be called in for you.  Please follow-up with your primary care provider in 2 days for continued evaluation.  ED immediately for new or worsening symptoms or concerns, such as vomiting, abdominal pain, dizziness or lightheadedness, fevers, chest pain, shortness of breath or any concerns at all.

## 2018-08-19 NOTE — ED Triage Notes (Signed)
C/o diarrhea x 4 days-abd pain earlier when he drank water-denies abd at this time-c/o HA-recent international air travel to [place]-NAD-steady gait

## 2018-08-20 ENCOUNTER — Telehealth (HOSPITAL_BASED_OUTPATIENT_CLINIC_OR_DEPARTMENT_OTHER): Payer: Self-pay | Admitting: *Deleted

## 2018-08-20 LAB — GASTROINTESTINAL PANEL BY PCR, STOOL (REPLACES STOOL CULTURE)

## 2018-08-22 DIAGNOSIS — F411 Generalized anxiety disorder: Secondary | ICD-10-CM | POA: Diagnosis not present

## 2018-08-25 ENCOUNTER — Ambulatory Visit (INDEPENDENT_AMBULATORY_CARE_PROVIDER_SITE_OTHER): Payer: BLUE CROSS/BLUE SHIELD | Admitting: Orthopaedic Surgery

## 2018-08-25 ENCOUNTER — Encounter (INDEPENDENT_AMBULATORY_CARE_PROVIDER_SITE_OTHER): Payer: Self-pay | Admitting: Orthopaedic Surgery

## 2018-08-25 ENCOUNTER — Ambulatory Visit (INDEPENDENT_AMBULATORY_CARE_PROVIDER_SITE_OTHER): Payer: BLUE CROSS/BLUE SHIELD

## 2018-08-25 DIAGNOSIS — Z96651 Presence of right artificial knee joint: Secondary | ICD-10-CM

## 2018-08-25 NOTE — Progress Notes (Signed)
The patient is now 9 months status post a right total knee arthroplasty.  This was done with a press-fit knee system.  He says his strength and range of motion are doing well but he still has some swelling and tightness.  He does get clicking in his knee as well.  He does workout quite a bit and does work with a trainer to get his knee strong.  He does occasionally walk still with a limp.  On exam he does still have some swelling in his right knee but no effusion.  His range of motion is full and the knee feels ligamentously stable.  There is some clicking in the knee in general.  2 views of the right knee show well-seated press-fit total knee arthroplasty with no evidence of loosening or complicating features.  Given reassurance that over with time the swelling will go down and is to get stronger the clicking will lessen or he may notice it less than.  He still may always have clicking though and explained this to him.  I would like to see him back in 6 months with a repeat 2 views of his right operative knee.  All question concerns were answered and addressed.

## 2018-08-26 DIAGNOSIS — F411 Generalized anxiety disorder: Secondary | ICD-10-CM | POA: Diagnosis not present

## 2018-09-12 DIAGNOSIS — F411 Generalized anxiety disorder: Secondary | ICD-10-CM | POA: Diagnosis not present

## 2018-09-16 DIAGNOSIS — F411 Generalized anxiety disorder: Secondary | ICD-10-CM | POA: Diagnosis not present

## 2018-09-23 DIAGNOSIS — F411 Generalized anxiety disorder: Secondary | ICD-10-CM | POA: Diagnosis not present

## 2018-09-25 DIAGNOSIS — F411 Generalized anxiety disorder: Secondary | ICD-10-CM | POA: Diagnosis not present

## 2018-09-30 DIAGNOSIS — F411 Generalized anxiety disorder: Secondary | ICD-10-CM | POA: Diagnosis not present

## 2018-10-02 DIAGNOSIS — F411 Generalized anxiety disorder: Secondary | ICD-10-CM | POA: Diagnosis not present

## 2018-10-07 DIAGNOSIS — F411 Generalized anxiety disorder: Secondary | ICD-10-CM | POA: Diagnosis not present

## 2018-10-09 DIAGNOSIS — F411 Generalized anxiety disorder: Secondary | ICD-10-CM | POA: Diagnosis not present

## 2018-10-14 DIAGNOSIS — F411 Generalized anxiety disorder: Secondary | ICD-10-CM | POA: Diagnosis not present

## 2018-10-15 DIAGNOSIS — F411 Generalized anxiety disorder: Secondary | ICD-10-CM | POA: Diagnosis not present

## 2018-10-21 DIAGNOSIS — F411 Generalized anxiety disorder: Secondary | ICD-10-CM | POA: Diagnosis not present

## 2018-10-22 DIAGNOSIS — F411 Generalized anxiety disorder: Secondary | ICD-10-CM | POA: Diagnosis not present

## 2018-10-28 DIAGNOSIS — F411 Generalized anxiety disorder: Secondary | ICD-10-CM | POA: Diagnosis not present

## 2018-10-30 DIAGNOSIS — F411 Generalized anxiety disorder: Secondary | ICD-10-CM | POA: Diagnosis not present

## 2018-11-04 DIAGNOSIS — F411 Generalized anxiety disorder: Secondary | ICD-10-CM | POA: Diagnosis not present

## 2018-11-06 DIAGNOSIS — M5412 Radiculopathy, cervical region: Secondary | ICD-10-CM | POA: Diagnosis not present

## 2018-11-06 DIAGNOSIS — F411 Generalized anxiety disorder: Secondary | ICD-10-CM | POA: Diagnosis not present

## 2018-11-13 ENCOUNTER — Other Ambulatory Visit: Payer: Self-pay | Admitting: Family Medicine

## 2018-11-13 ENCOUNTER — Ambulatory Visit
Admission: RE | Admit: 2018-11-13 | Discharge: 2018-11-13 | Disposition: A | Discharge status: Home / Self Care | Payer: BLUE CROSS/BLUE SHIELD | Source: Ambulatory Visit | Attending: Family Medicine | Admitting: Family Medicine

## 2018-11-13 ENCOUNTER — Other Ambulatory Visit: Payer: Self-pay

## 2018-11-13 DIAGNOSIS — M4722 Other spondylosis with radiculopathy, cervical region: Secondary | ICD-10-CM | POA: Diagnosis not present

## 2018-11-13 DIAGNOSIS — M5412 Radiculopathy, cervical region: Secondary | ICD-10-CM

## 2018-11-19 DIAGNOSIS — F411 Generalized anxiety disorder: Secondary | ICD-10-CM | POA: Diagnosis not present

## 2018-11-25 DIAGNOSIS — F411 Generalized anxiety disorder: Secondary | ICD-10-CM | POA: Diagnosis not present

## 2018-11-26 DIAGNOSIS — F411 Generalized anxiety disorder: Secondary | ICD-10-CM | POA: Diagnosis not present

## 2018-12-02 DIAGNOSIS — F411 Generalized anxiety disorder: Secondary | ICD-10-CM | POA: Diagnosis not present

## 2018-12-03 DIAGNOSIS — F411 Generalized anxiety disorder: Secondary | ICD-10-CM | POA: Diagnosis not present

## 2018-12-09 DIAGNOSIS — F411 Generalized anxiety disorder: Secondary | ICD-10-CM | POA: Diagnosis not present

## 2018-12-17 DIAGNOSIS — F411 Generalized anxiety disorder: Secondary | ICD-10-CM | POA: Diagnosis not present

## 2018-12-23 DIAGNOSIS — F411 Generalized anxiety disorder: Secondary | ICD-10-CM | POA: Diagnosis not present

## 2018-12-30 DIAGNOSIS — F411 Generalized anxiety disorder: Secondary | ICD-10-CM | POA: Diagnosis not present

## 2019-01-06 DIAGNOSIS — F411 Generalized anxiety disorder: Secondary | ICD-10-CM | POA: Diagnosis not present

## 2019-01-07 DIAGNOSIS — F411 Generalized anxiety disorder: Secondary | ICD-10-CM | POA: Diagnosis not present

## 2019-01-13 DIAGNOSIS — F411 Generalized anxiety disorder: Secondary | ICD-10-CM | POA: Diagnosis not present

## 2019-01-14 DIAGNOSIS — F411 Generalized anxiety disorder: Secondary | ICD-10-CM | POA: Diagnosis not present

## 2019-01-20 DIAGNOSIS — F411 Generalized anxiety disorder: Secondary | ICD-10-CM | POA: Diagnosis not present

## 2019-01-28 DIAGNOSIS — F411 Generalized anxiety disorder: Secondary | ICD-10-CM | POA: Diagnosis not present

## 2019-01-29 DIAGNOSIS — R7989 Other specified abnormal findings of blood chemistry: Secondary | ICD-10-CM | POA: Diagnosis not present

## 2019-01-29 DIAGNOSIS — N529 Male erectile dysfunction, unspecified: Secondary | ICD-10-CM | POA: Diagnosis not present

## 2019-01-29 DIAGNOSIS — N400 Enlarged prostate without lower urinary tract symptoms: Secondary | ICD-10-CM | POA: Diagnosis not present

## 2019-02-03 DIAGNOSIS — F411 Generalized anxiety disorder: Secondary | ICD-10-CM | POA: Diagnosis not present

## 2019-02-10 DIAGNOSIS — F411 Generalized anxiety disorder: Secondary | ICD-10-CM | POA: Diagnosis not present

## 2019-02-12 DIAGNOSIS — J4530 Mild persistent asthma, uncomplicated: Secondary | ICD-10-CM | POA: Diagnosis not present

## 2019-02-12 DIAGNOSIS — M25551 Pain in right hip: Secondary | ICD-10-CM | POA: Diagnosis not present

## 2019-02-13 DIAGNOSIS — F411 Generalized anxiety disorder: Secondary | ICD-10-CM | POA: Diagnosis not present

## 2019-02-16 DIAGNOSIS — F411 Generalized anxiety disorder: Secondary | ICD-10-CM | POA: Diagnosis not present

## 2019-02-24 DIAGNOSIS — F411 Generalized anxiety disorder: Secondary | ICD-10-CM | POA: Diagnosis not present

## 2019-02-25 ENCOUNTER — Ambulatory Visit (INDEPENDENT_AMBULATORY_CARE_PROVIDER_SITE_OTHER): Payer: BLUE CROSS/BLUE SHIELD | Admitting: Orthopaedic Surgery

## 2019-02-26 ENCOUNTER — Encounter: Payer: Self-pay | Admitting: Orthopaedic Surgery

## 2019-02-26 ENCOUNTER — Ambulatory Visit: Payer: BC Managed Care – PPO

## 2019-02-26 ENCOUNTER — Ambulatory Visit: Payer: Self-pay

## 2019-02-26 ENCOUNTER — Ambulatory Visit (INDEPENDENT_AMBULATORY_CARE_PROVIDER_SITE_OTHER): Payer: BC Managed Care – PPO | Admitting: Orthopaedic Surgery

## 2019-02-26 DIAGNOSIS — Z96651 Presence of right artificial knee joint: Secondary | ICD-10-CM | POA: Diagnosis not present

## 2019-02-26 DIAGNOSIS — M25551 Pain in right hip: Secondary | ICD-10-CM

## 2019-02-26 MED ORDER — METHYLPREDNISOLONE 4 MG PO TABS: ORAL_TABLET | ORAL | 0 refills | Status: DC

## 2019-02-26 NOTE — Progress Notes (Signed)
Office Visit Note   Patient: [facility]           Date of Birth: [birth year]           MRN: [id number] Visit Date: 02/26/2019              Requested by: Vernie Shanks, MD [place],  [place] [address] PCP: Vernie Shanks, MD   Assessment & Plan: Visit Diagnoses:  1. History of total right knee replacement   2. Pain in right hip     Plan: I feel that his hip symptoms on the right side are combination of trochanteric bursitis but also sciatica.  I will put him on a 6-day steroid taper and if this does not help things we will consider formal outpatient physical therapy or even MRI of the lumbar spine.  I did show him stretching exercises for both areas and he demonstrated these back to me.  Hopefully this will help him.  All question concerns were answered addressed.  Follow-up as otherwise as needed.  Follow-Up Instructions: Return if symptoms worsen or fail to improve.   Orders:  Orders Placed This Encounter  Procedures   XR Knee 1-2 Views Right   XR HIP UNILAT W OR W/O PELVIS 1V RIGHT   Meds ordered this encounter  Medications   methylPREDNISolone (MEDROL) 4 MG tablet    Sig: Medrol dose pack. Take as instructed    Dispense:  21 tablet    Refill:  0      Procedures: No procedures performed   Clinical Data: No additional findings.   Subjective: Chief Complaint  Patient presents with   Right Knee - Follow-up  The patient is now 15 months status post a right total knee arthroplasty.  He said the knee is stiff at times and does have some tightness but overall he feels like he is doing well with the knee.  He has been having some hip pain he points the lateral aspect of his right hip but also in the sciatic area as a source of his pain.  This occurs when he walks on occasion for long distances.  He says he steps about 100,000 steps a week.  He feels like this may be overcompensation.  He denies any weakness or numbness and tingling in his feet but  he does have a occasionally odd sensation going down his right leg on the lateral aspect.  He denies any change in bowel bladder function.  He does take meloxicam but he does not feel that this helped significantly but it does take the edge off of his symptoms.  HPI  Review of Systems He currently denies a headache, chest pain, shortness of breath, fever, chills, nausea, vomiting  Objective: Vital Signs: There were no vitals taken for this visit.  Physical Exam He is alert and orient x3 and in no acute distress Ortho Exam Examination of both hips show the hip fluid and full range of motion.  Examination of his right operative knee shows excellent full motion and it is ligamentously stable.  His right hip hurts only over the trochanteric area to palpation but some in the sciatic region as well.  He has pain with flexion extension of his lumbar spine. Specialty Comments:  No specialty comments available.  Imaging: Xr Hip Unilat W Or W/o Pelvis 1v Right  Result Date: 02/26/2019 An AP pelvis and lateral right hip shows no acute findings of the right hip.  The joint space  is well-maintained.  The lower aspect of the lumbar spine can be seen on the AP view and there is degenerative disc disease at the lower levels of the lumbar spine.  Xr Knee 1-2 Views Right  Result Date: 02/26/2019 2 views of the right knee show a total knee arthroplasty with no complicating features.    PMFS History: Patient Active Problem List   Diagnosis Date Noted   Status post total right knee replacement 12/09/2017   Cellulitis of right knee 11/26/2017   Status post arthroscopy of right knee 07/10/2017   Unilateral primary osteoarthritis, right knee 07/10/2017   GERD with stricture 03/19/2017   Pain in right hip 03/04/2017   Acute pain of right knee 03/04/2017   Trochanteric bursitis, right hip [id number]   Eosinophilic esophagitis [id number]   Past Medical History:  Diagnosis Date   Allergy      Eosinophilic esophagitis [id number]   Erectile dysfunction    Esophageal laceration 12/04/2016   Food impaction of esophagus 12/04/2016   GERD with stricture    Melanoma ([place])    back   Mild asthma    Seasonal allergies    Testosterone deficiency     Family History  Problem Relation Age of Onset   Hypertension Other    Colon cancer Neg Hx    Esophageal cancer Neg Hx    Rectal cancer Neg Hx    Stomach cancer Neg Hx     Past Surgical History:  Procedure Laterality Date   [place]ONOSCOPY  2015   in [place]   ESOPHAGOGASTRODUODENOSCOPY N/A 12/04/2016   Procedure: ESOPHAGOGASTRODUODENOSCOPY (EGD);  Surgeon: [name], MD;  Location: [facility] ENDOSCOPY;  Service: Endoscopy;  Laterality: N/A;   HERNIA REPAIR     KNEE ARTHROCENTESIS     KNEE ARTHROSCOPY Right    NASAL SINUS SURGERY     TONSILLECTOMY     TOTAL KNEE ARTHROPLASTY Right 11/22/2017   Procedure: RIGHT TOTAL KNEE ARTHROPLASTY;  Surgeon: Mcarthur Rossetti, MD;  Location: [facility];  Service: Orthopedics;  Laterality: Right;  Adductor Block   Social History   Occupational History    Employer: Metallurgist  Tobacco Use   Smoking status: Never Smoker   Smokeless tobacco: Former Systems developer    Types: Snuff  Substance and Sexual Activity   Alcohol use: Yes    Comment: occassional   Drug use: No   Sexual activity: Not on file

## 2019-03-02 DIAGNOSIS — F411 Generalized anxiety disorder: Secondary | ICD-10-CM | POA: Diagnosis not present

## 2019-03-16 DIAGNOSIS — F411 Generalized anxiety disorder: Secondary | ICD-10-CM | POA: Diagnosis not present

## 2019-03-17 DIAGNOSIS — F411 Generalized anxiety disorder: Secondary | ICD-10-CM | POA: Diagnosis not present

## 2019-03-24 DIAGNOSIS — F411 Generalized anxiety disorder: Secondary | ICD-10-CM | POA: Diagnosis not present

## 2019-03-30 DIAGNOSIS — F411 Generalized anxiety disorder: Secondary | ICD-10-CM | POA: Diagnosis not present

## 2019-03-31 DIAGNOSIS — F411 Generalized anxiety disorder: Secondary | ICD-10-CM | POA: Diagnosis not present

## 2019-04-01 DIAGNOSIS — J310 Chronic rhinitis: Secondary | ICD-10-CM | POA: Diagnosis not present

## 2019-04-01 DIAGNOSIS — J339 Nasal polyp, unspecified: Secondary | ICD-10-CM | POA: Diagnosis not present

## 2019-04-06 DIAGNOSIS — F411 Generalized anxiety disorder: Secondary | ICD-10-CM | POA: Diagnosis not present

## 2019-04-13 DIAGNOSIS — F411 Generalized anxiety disorder: Secondary | ICD-10-CM | POA: Diagnosis not present

## 2019-04-21 DIAGNOSIS — F411 Generalized anxiety disorder: Secondary | ICD-10-CM | POA: Diagnosis not present

## 2019-04-23 DIAGNOSIS — G5602 Carpal tunnel syndrome, left upper limb: Secondary | ICD-10-CM | POA: Diagnosis not present

## 2019-04-28 DIAGNOSIS — F411 Generalized anxiety disorder: Secondary | ICD-10-CM | POA: Diagnosis not present

## 2019-05-05 DIAGNOSIS — F411 Generalized anxiety disorder: Secondary | ICD-10-CM | POA: Diagnosis not present

## 2019-05-11 DIAGNOSIS — F411 Generalized anxiety disorder: Secondary | ICD-10-CM | POA: Diagnosis not present

## 2019-05-18 DIAGNOSIS — F411 Generalized anxiety disorder: Secondary | ICD-10-CM | POA: Diagnosis not present

## 2019-05-19 DIAGNOSIS — F411 Generalized anxiety disorder: Secondary | ICD-10-CM | POA: Diagnosis not present

## 2019-05-25 DIAGNOSIS — F411 Generalized anxiety disorder: Secondary | ICD-10-CM | POA: Diagnosis not present

## 2019-05-26 DIAGNOSIS — F411 Generalized anxiety disorder: Secondary | ICD-10-CM | POA: Diagnosis not present

## 2019-06-01 DIAGNOSIS — F411 Generalized anxiety disorder: Secondary | ICD-10-CM | POA: Diagnosis not present

## 2019-06-02 DIAGNOSIS — F411 Generalized anxiety disorder: Secondary | ICD-10-CM | POA: Diagnosis not present

## 2019-06-03 IMAGING — CR CERVICAL SPINE - COMPLETE 4+ VIEW
5 series · 5 of 5 positions shown · non-contrast
Comparison: None.

CLINICAL DATA: Left upper extremity radicular symptoms

EXAM:
CERVICAL SPINE - COMPLETE 4+ VIEW

[w cervical spine lat]
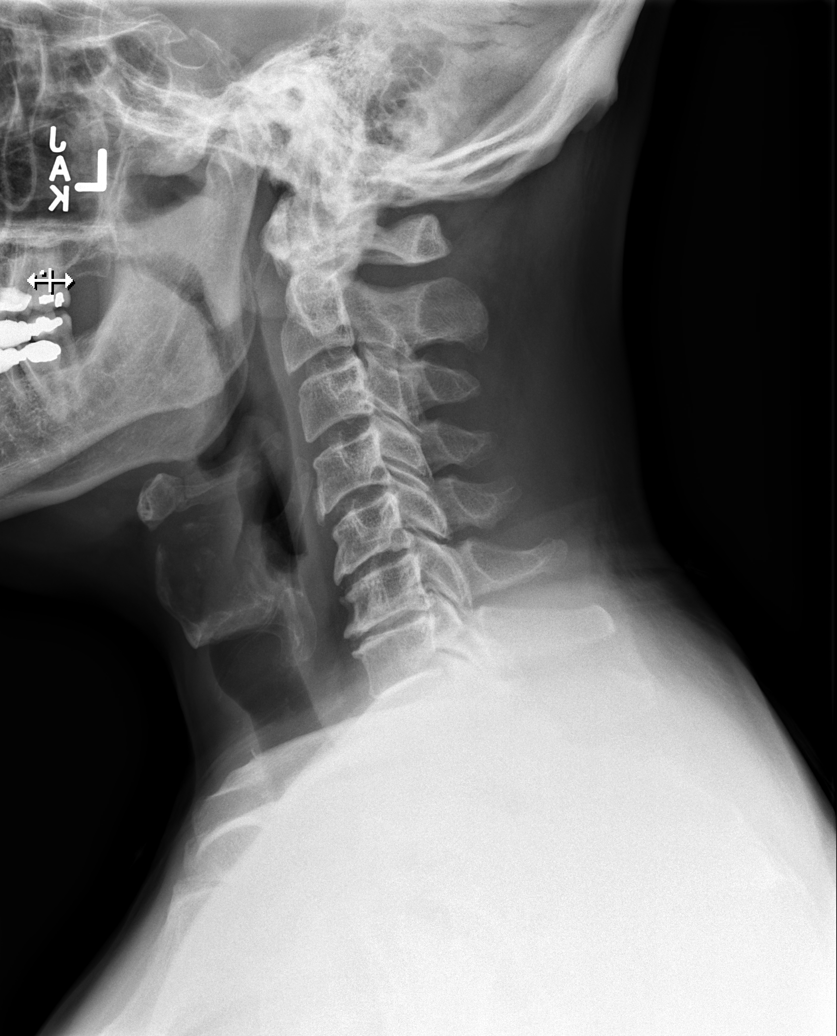

[w cervical spine ap_obl (1 of 2)]
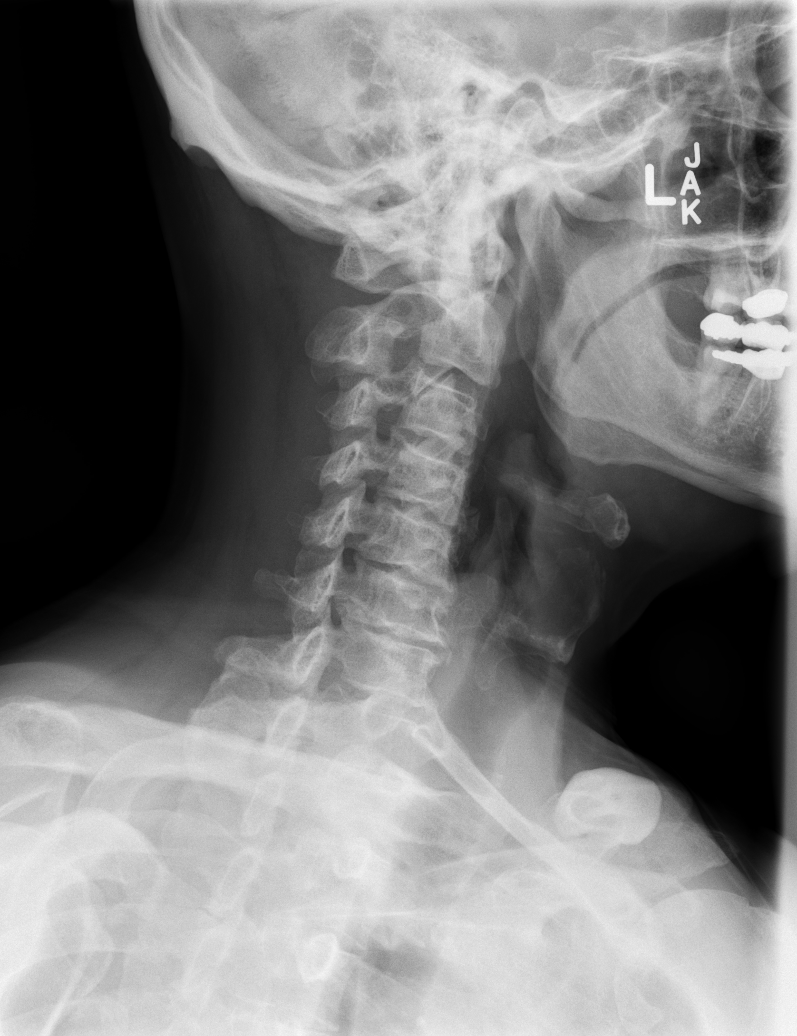

[w cervical spine ap_obl (2 of 2)]
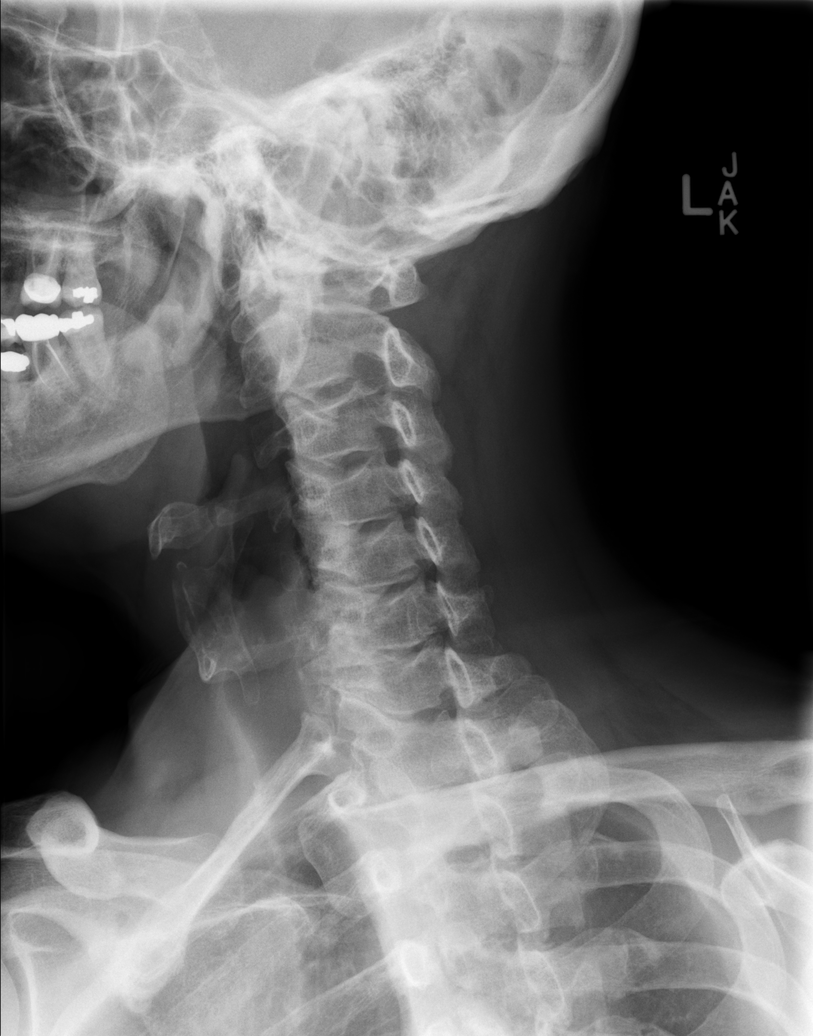

[w cervical spine ap (1 of 2)]
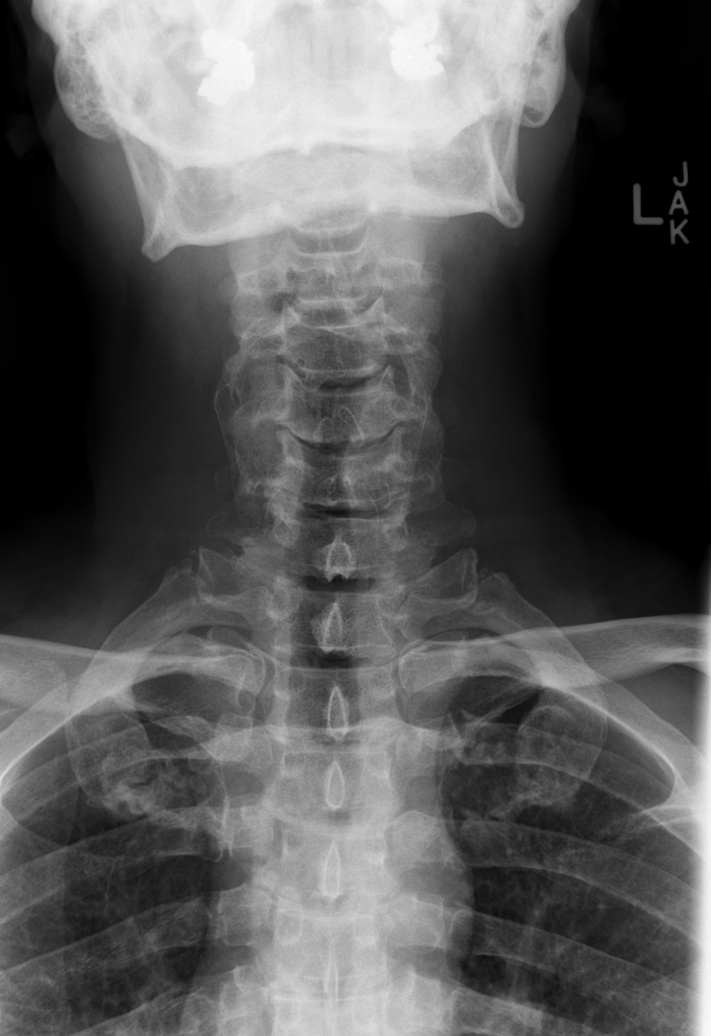

[w cervical spine ap (2 of 2)]
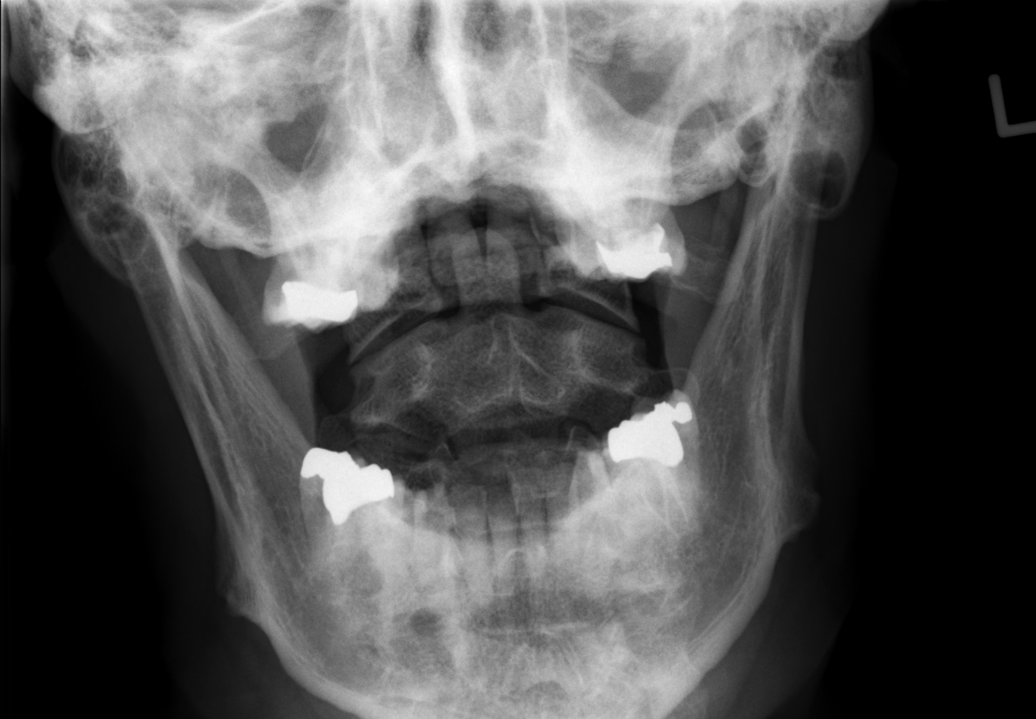

[5 of 5 positions shown; findings below may reference images not displayed]

FINDINGS: Frontal, lateral, open-mouth odontoid, bilateral oblique views were
obtained. There is no evident fracture or spondylolisthesis.
Prevertebral soft tissues and predental space regions are normal.
There is severe sd space narrowing at C6-7 with moderate disc space
narrowing at C5-6. There are anterior osteophytes at C4, C5, C6, and
C7. There is facet hypertrophy at C3-4, C4-5, C5-6, and C6-7
bilaterally with exit foraminal narrowing at these levels
bilaterally. Lung apices are clear.
IMPRESSION: Multilevel osteoarthritic change.  No fracture or spondylolisthesis.

## 2019-06-08 ENCOUNTER — Other Ambulatory Visit: Payer: Self-pay | Admitting: Internal Medicine

## 2019-06-08 DIAGNOSIS — F411 Generalized anxiety disorder: Secondary | ICD-10-CM | POA: Diagnosis not present

## 2019-06-09 DIAGNOSIS — F411 Generalized anxiety disorder: Secondary | ICD-10-CM | POA: Diagnosis not present

## 2019-06-15 DIAGNOSIS — Z20828 Contact with and (suspected) exposure to other viral communicable diseases: Secondary | ICD-10-CM | POA: Diagnosis not present

## 2019-06-15 DIAGNOSIS — Z03818 Encounter for observation for suspected exposure to other biological agents ruled out: Secondary | ICD-10-CM | POA: Diagnosis not present

## 2019-06-22 DIAGNOSIS — F411 Generalized anxiety disorder: Secondary | ICD-10-CM | POA: Diagnosis not present

## 2019-06-23 DIAGNOSIS — F411 Generalized anxiety disorder: Secondary | ICD-10-CM | POA: Diagnosis not present

## 2019-06-30 DIAGNOSIS — F411 Generalized anxiety disorder: Secondary | ICD-10-CM | POA: Diagnosis not present

## 2019-07-06 DIAGNOSIS — F411 Generalized anxiety disorder: Secondary | ICD-10-CM | POA: Diagnosis not present

## 2019-07-07 DIAGNOSIS — F411 Generalized anxiety disorder: Secondary | ICD-10-CM | POA: Diagnosis not present

## 2019-07-13 DIAGNOSIS — F411 Generalized anxiety disorder: Secondary | ICD-10-CM | POA: Diagnosis not present

## 2019-07-14 DIAGNOSIS — F411 Generalized anxiety disorder: Secondary | ICD-10-CM | POA: Diagnosis not present

## 2019-07-15 ENCOUNTER — Ambulatory Visit (INDEPENDENT_AMBULATORY_CARE_PROVIDER_SITE_OTHER): Payer: BC Managed Care – PPO | Admitting: Clinical

## 2019-07-15 DIAGNOSIS — F89 Unspecified disorder of psychological development: Secondary | ICD-10-CM | POA: Diagnosis not present

## 2019-07-20 DIAGNOSIS — F411 Generalized anxiety disorder: Secondary | ICD-10-CM | POA: Diagnosis not present

## 2019-07-21 DIAGNOSIS — F411 Generalized anxiety disorder: Secondary | ICD-10-CM | POA: Diagnosis not present

## 2019-07-27 DIAGNOSIS — F411 Generalized anxiety disorder: Secondary | ICD-10-CM | POA: Diagnosis not present

## 2019-07-28 DIAGNOSIS — F411 Generalized anxiety disorder: Secondary | ICD-10-CM | POA: Diagnosis not present

## 2019-08-03 DIAGNOSIS — F411 Generalized anxiety disorder: Secondary | ICD-10-CM | POA: Diagnosis not present

## 2019-08-04 DIAGNOSIS — F332 Major depressive disorder, recurrent severe without psychotic features: Secondary | ICD-10-CM | POA: Diagnosis not present

## 2019-08-04 DIAGNOSIS — E291 Testicular hypofunction: Secondary | ICD-10-CM | POA: Diagnosis not present

## 2019-08-04 DIAGNOSIS — F411 Generalized anxiety disorder: Secondary | ICD-10-CM | POA: Diagnosis not present

## 2019-08-07 DIAGNOSIS — R7989 Other specified abnormal findings of blood chemistry: Secondary | ICD-10-CM | POA: Diagnosis not present

## 2019-08-10 ENCOUNTER — Ambulatory Visit: Payer: BC Managed Care – PPO | Admitting: Psychiatry

## 2019-08-18 DIAGNOSIS — F411 Generalized anxiety disorder: Secondary | ICD-10-CM | POA: Diagnosis not present

## 2019-08-20 DIAGNOSIS — R29898 Other symptoms and signs involving the musculoskeletal system: Secondary | ICD-10-CM | POA: Diagnosis not present

## 2019-08-20 DIAGNOSIS — M5412 Radiculopathy, cervical region: Secondary | ICD-10-CM | POA: Diagnosis not present

## 2019-08-20 DIAGNOSIS — R937 Abnormal findings on diagnostic imaging of other parts of musculoskeletal system: Secondary | ICD-10-CM | POA: Diagnosis not present

## 2019-08-25 DIAGNOSIS — F411 Generalized anxiety disorder: Secondary | ICD-10-CM | POA: Diagnosis not present

## 2019-08-27 DIAGNOSIS — M5412 Radiculopathy, cervical region: Secondary | ICD-10-CM | POA: Diagnosis not present

## 2019-08-27 DIAGNOSIS — R937 Abnormal findings on diagnostic imaging of other parts of musculoskeletal system: Secondary | ICD-10-CM | POA: Diagnosis not present

## 2019-08-27 DIAGNOSIS — R29898 Other symptoms and signs involving the musculoskeletal system: Secondary | ICD-10-CM | POA: Diagnosis not present

## 2019-08-31 ENCOUNTER — Other Ambulatory Visit: Payer: Self-pay | Admitting: Internal Medicine

## 2019-09-01 DIAGNOSIS — F332 Major depressive disorder, recurrent severe without psychotic features: Secondary | ICD-10-CM | POA: Diagnosis not present

## 2019-09-01 DIAGNOSIS — F411 Generalized anxiety disorder: Secondary | ICD-10-CM | POA: Diagnosis not present

## 2019-09-02 ENCOUNTER — Telehealth: Payer: Self-pay | Admitting: Internal Medicine

## 2019-09-02 DIAGNOSIS — M5412 Radiculopathy, cervical region: Secondary | ICD-10-CM | POA: Diagnosis not present

## 2019-09-02 MED ORDER — PANTOPRAZOLE SODIUM 40 MG PO TBEC: 40.0000 mg | DELAYED_RELEASE_TABLET | Freq: Every day | ORAL | 0 refills | Status: DC

## 2019-09-02 NOTE — Telephone Encounter (Signed)
Pantoprazole refilled as requested. 

## 2019-09-04 DIAGNOSIS — M5412 Radiculopathy, cervical region: Secondary | ICD-10-CM | POA: Diagnosis not present

## 2019-09-08 DIAGNOSIS — F411 Generalized anxiety disorder: Secondary | ICD-10-CM | POA: Diagnosis not present

## 2019-09-08 DIAGNOSIS — M5412 Radiculopathy, cervical region: Secondary | ICD-10-CM | POA: Diagnosis not present

## 2019-09-09 ENCOUNTER — Ambulatory Visit (INDEPENDENT_AMBULATORY_CARE_PROVIDER_SITE_OTHER): Payer: BC Managed Care – PPO | Admitting: Clinical

## 2019-09-09 DIAGNOSIS — F89 Unspecified disorder of psychological development: Secondary | ICD-10-CM | POA: Diagnosis not present

## 2019-09-11 DIAGNOSIS — M5412 Radiculopathy, cervical region: Secondary | ICD-10-CM | POA: Diagnosis not present

## 2019-09-15 DIAGNOSIS — M5412 Radiculopathy, cervical region: Secondary | ICD-10-CM | POA: Diagnosis not present

## 2019-09-18 DIAGNOSIS — M5412 Radiculopathy, cervical region: Secondary | ICD-10-CM | POA: Diagnosis not present

## 2019-09-21 DIAGNOSIS — M542 Cervicalgia: Secondary | ICD-10-CM | POA: Diagnosis not present

## 2019-09-21 DIAGNOSIS — G5603 Carpal tunnel syndrome, bilateral upper limbs: Secondary | ICD-10-CM | POA: Diagnosis not present

## 2019-09-21 DIAGNOSIS — M5412 Radiculopathy, cervical region: Secondary | ICD-10-CM | POA: Diagnosis not present

## 2019-09-21 DIAGNOSIS — M47812 Spondylosis without myelopathy or radiculopathy, cervical region: Secondary | ICD-10-CM | POA: Diagnosis not present

## 2019-09-22 DIAGNOSIS — F411 Generalized anxiety disorder: Secondary | ICD-10-CM | POA: Diagnosis not present

## 2019-09-23 DIAGNOSIS — M5412 Radiculopathy, cervical region: Secondary | ICD-10-CM | POA: Diagnosis not present

## 2019-09-25 DIAGNOSIS — M5412 Radiculopathy, cervical region: Secondary | ICD-10-CM | POA: Diagnosis not present

## 2019-09-29 DIAGNOSIS — F411 Generalized anxiety disorder: Secondary | ICD-10-CM | POA: Diagnosis not present

## 2019-09-29 DIAGNOSIS — F332 Major depressive disorder, recurrent severe without psychotic features: Secondary | ICD-10-CM | POA: Diagnosis not present

## 2019-10-05 ENCOUNTER — Ambulatory Visit: Payer: BC Managed Care – PPO | Admitting: Internal Medicine

## 2019-10-05 ENCOUNTER — Encounter: Payer: Self-pay | Admitting: Internal Medicine

## 2019-10-05 VITALS — BP 110/64 | HR 72 | Temp 97.8°F | Ht 71.5 in | Wt 217.0 lb

## 2019-10-05 DIAGNOSIS — K222 Esophageal obstruction: Secondary | ICD-10-CM | POA: Diagnosis not present

## 2019-10-05 DIAGNOSIS — K200 Eosinophilic esophagitis: Secondary | ICD-10-CM

## 2019-10-05 DIAGNOSIS — K219 Gastro-esophageal reflux disease without esophagitis: Secondary | ICD-10-CM | POA: Diagnosis not present

## 2019-10-05 MED ORDER — AMBULATORY NON FORMULARY MEDICATION: 1 refills | Status: AC

## 2019-10-05 NOTE — Assessment & Plan Note (Signed)
Stay on PPI See EoE A/P

## 2019-10-05 NOTE — Assessment & Plan Note (Addendum)
Last year I think/thought no sig dysphagia He is having it at least weekly now, so his condition has deteriorated Plan is to treat with Budesonide suspension for Eosinophilic esophagitis 1mg  bid x 1 month and then he is to message me with response If all ok - use that prn  If not then re-EGD, bx, dilate ?  Has seen allergy in past and we can consider that, monteleukast is an option also I also mentioned the possibility of needing an esophageal manometry to understand his dysphagia perhaps.

## 2019-10-05 NOTE — Patient Instructions (Addendum)
If you are age 59 or older, your body mass index should be between 23-30. Your Body mass index is 29.84 kg/m. If this is out of the aforementioned range listed, please consider follow up with your Primary Care Provider.  If you are age 47 or younger, your body mass index should be between 19-25. Your Body mass index is 29.84 kg/m. If this is out of the aformentioned range listed, please consider follow up with your Primary Care Provider.    We have given you a prescription for Budesonide Suspension for you to take to [facility]'s information is below: Address: [address] [place], [place] [address]  [phone]   Thank you for choosing me and [facility].  Dr. Carlean Purl

## 2019-10-05 NOTE — Progress Notes (Signed)
[facility] 59 y.o. December 27, 1960 [id number]  Assessment & Plan:  GERD with stricture Stay on PPI See EoE A/P  Eosinophilic esophagitis Last year I think/thought no sig dysphagia He is having it at least weekly now, so his condition has deteriorated Plan is to treat with Budesonide suspension for Eosinophilic esophagitis 57m bid x 1 month and then he is to message me with response If all ok - use that prn  If not then re-EGD, bx, dilate ?  Has seen allergy in past and we can consider that, monteleukast is an option also I also mentioned the possibility of needing an esophageal manometry to understand his dysphagia perhaps.     I appreciate the opportunity to care for this patient. CC: WVernie Shanks MD      Subjective:   Chief Complaint: Follow-up of GERD and eosinophilic esophagitis  HPI  [facility] here for follow-up of his eosinophilic esophagitis and GERD.  Heartburn is well controlled overall very rarely he has to take 2 PPI pills in a day.  He is having dysphagia at least on a weekly basis to solid foods.  Chart review indicates that when I first met him in 2018 he actually had a significant esophageal laceration related to a food impaction and vomiting and retching was observed overnight, follow-up EGD shortly after that in 2018 in August (originally June) showed reflux esophagitis changes and eosinophilic esophagitis and he was dilated with a 54 [place] dilator.  He had persistent issues with dysphagia he was treated with budesonide for a month and I rescoped him in December 2018 at which point things looked better and he was dilated again.  A barium swallow was done to exclude Zenker's diverticulum and other causes of dysphagia and did so.  Last year I thought he was doing well with only some regurgitation and no dysphagia.  [place] [employer] and returned and at this point says he is having  dysphagia weekly as above.  The last time was pizza just the other day.  He has to drink water to make it go down. Allergies  Allergen Reactions   Penicillins Anaphylaxis    Has patient had a PCN reaction causing immediate rash, facial/tongue/throat swelling, SOB or lightheadedness with hypotension: No Has patient had a PCN reaction causing severe rash involving mucus membranes or skin necrosis: No Has patient had a PCN reaction that required hospitalization: No Has patient had a PCN reaction occurring within the last 10 years: No If all of the above answers are "NO", then may proceed with Cephalosporin use.   Grass Extracts [Gramineae Pollens]    Other     Trees, pollen, mildew, cats, dogs    Current Meds  Medication Sig   albuterol (PROVENTIL HFA;VENTOLIN HFA) 108 (90 Base) MCG/ACT inhaler Inhale 1 puff into the lungs as needed for wheezing.    buPROPion (WELLBUTRIN XL) 150 MG 24 hr tablet Take 150 mg by mouth at bedtime.   Cholecalciferol (VITAMIN D-1000 MAX ST) 1000 units tablet Take 1,000 Units by mouth daily.   DYMISTA 137-50 MCG/ACT SUSP Place 1 spray into both nostrils every morning.    fexofenadine (ALLEGRA ALLERGY) 60 MG tablet Take 1 tablet by mouth daily.   Fluticasone-Salmeterol,sensor, (AIRDUO DIGIHALER) 55-14 MCG/ACT AEPB Inhale 1 puff into the lungs daily.   hydrOXYzine (ATARAX/VISTARIL) 25 MG tablet Take 25-50 mg by mouth at bedtime as needed.   Naphazoline-Glycerin-Zinc  Sulf (CLEAR EYES SEASONAL RELIEF) 0.012-0.2-0.25 % SOLN Apply 1 drop to eye 2 (two) times daily as needed.   pantoprazole (PROTONIX) 40 MG tablet Take 1 tablet (40 mg total) by mouth daily.   sildenafil (REVATIO) 20 MG tablet Take 20 mg by mouth daily as needed.    testosterone cypionate (DEPO-TESTOSTERONE) 200 MG/ML injection Inject 200 mg into the muscle See admin instructions. Every two weeks   Past Medical History:  Diagnosis Date   Allergy    Eosinophilic esophagitis 07/21/3005    Erectile dysfunction    Esophageal laceration 12/04/2016   Food impaction of esophagus 12/04/2016   GERD with stricture    Melanoma ([place])    back   Mild asthma    Seasonal allergies    Testosterone deficiency    Past Surgical History:  Procedure Laterality Date   [place]ONOSCOPY  2015   in [place]   ESOPHAGOGASTRODUODENOSCOPY N/A 12/04/2016   Procedure: ESOPHAGOGASTRODUODENOSCOPY (EGD);  Surgeon: Marcus Mayer, MD;  Location: [facility] ENDOSCOPY;  Service: Endoscopy;  Laterality: N/A;   HERNIA REPAIR     KNEE ARTHROCENTESIS     KNEE ARTHROSCOPY Right    NASAL SINUS SURGERY     TONSILLECTOMY     TOTAL KNEE ARTHROPLASTY Right 11/22/2017   Procedure: RIGHT TOTAL KNEE ARTHROPLASTY;  Surgeon: Marcus Rossetti, MD;  Location: [facility];  Service: Orthopedics;  Laterality: Right;  Adductor Block   Social History   Social History Narrative   Married - from [place] area - moved to [place] approx 2015 to work for [employer]   family history includes Hypertension in an other family member.   Review of Systems As above  Objective:   Physical Exam BP 110/64 (BP Location: Left Arm, Patient Position: Sitting, Cuff Size: Normal)    Pulse 72    Temp 97.8 F (36.6 C)    Ht 5' 11.5" (1.816 m) Comment: height measured without shoes   Wt 217 lb (98.4 kg)    BMI 29.84 kg/m

## 2019-10-06 DIAGNOSIS — M542 Cervicalgia: Secondary | ICD-10-CM | POA: Diagnosis not present

## 2019-10-06 DIAGNOSIS — M5412 Radiculopathy, cervical region: Secondary | ICD-10-CM | POA: Diagnosis not present

## 2019-10-13 DIAGNOSIS — F411 Generalized anxiety disorder: Secondary | ICD-10-CM | POA: Diagnosis not present

## 2019-10-20 ENCOUNTER — Encounter: Payer: BC Managed Care – PPO | Admitting: Neurology

## 2019-10-20 ENCOUNTER — Ambulatory Visit (INDEPENDENT_AMBULATORY_CARE_PROVIDER_SITE_OTHER): Payer: BC Managed Care – PPO | Admitting: Neurology

## 2019-10-20 ENCOUNTER — Telehealth: Payer: Self-pay

## 2019-10-20 ENCOUNTER — Other Ambulatory Visit: Payer: Self-pay

## 2019-10-20 DIAGNOSIS — M5412 Radiculopathy, cervical region: Secondary | ICD-10-CM | POA: Diagnosis not present

## 2019-10-20 DIAGNOSIS — G5603 Carpal tunnel syndrome, bilateral upper limbs: Secondary | ICD-10-CM | POA: Insufficient documentation

## 2019-10-20 DIAGNOSIS — R200 Anesthesia of skin: Secondary | ICD-10-CM | POA: Insufficient documentation

## 2019-10-20 DIAGNOSIS — Z0289 Encounter for other administrative examinations: Secondary | ICD-10-CM

## 2019-10-20 NOTE — Progress Notes (Signed)
Full Name: Marcus Watson Gender: Male MRN #: [id number] Date of Birth: Jan 03, 1961    Visit Date: 10/20/2019 07:23 Age: 59 Years Examining Physician: Arlice Colt, MD Referring Physician: Erline Levine, MD Height: 5 feet 11 inch     History: Marcus Watson is a 59 year old man with left greater than right arm pain.  On the left pain radiates from the neck into the shoulder and down the arm but more intense.  Sometimes will be tingling in the hand.  On the right, pain is less intense with a similar distribution.  He will sometimes awaken at night with tingling that is better with movement.  He was just taken.  He had 4+/5 strength in the APB muscle on the left and normal strength elsewhere.  Nerve conduction studies: Bilateral median motor responses had delayed distal latencies with normal amplitudes and forearm conduction.  Bilateral ulnar motor responses were normal.  F-wave latencies of the ulnar nerves were borderline prolonged.  Median sensory responses were delayed across the wrist with a reduced amplitude on the left and a borderline amplitude in the right.  Ulnar sensory responses were normal.  Electromyography: Needle EMG of selected muscles of both arms was performed.  Mild chronic denervation was noted with in the left deltoid, triceps, extensor digitorum communis and supraspinatus muscles.  In the right arm, mild chronic denervation was noted in the triceps and extensor digitorum communis muscles.  There was no abnormal spontaneous activity.   Impression: This NCV/EMG study shows the following: 1.   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 2.   Mild bilateral C7 and left C6 chronic radiculopathies.  No acute denervation was noted.  Marcus Watson A. Felecia Shelling, MD, PhD, FAAN Certified in Neurology, Clinical Neurophysiology, Sleep Medicine, Pain Medicine and Neuroimaging Director, [facility] [address] [place], [place] [address] [phone]       [facility]    Nerve / Sites Muscle Latency Ref. Amplitude Ref. Rel Amp Segments Distance Velocity Ref. Area    ms ms mV mV %  cm m/s m/s mVms  L Median - APB     Wrist APB 5.0 ?4.4 6.1 ?4.0 100 Wrist - APB 7   23.6     Upper arm APB 9.8  5.2  85.1 Upper arm - Wrist 24 50 ?49 20.5  R Median - APB     Wrist APB 4.7 ?4.4 6.2 ?4.0 100 Wrist - APB 7   28.8     Upper arm APB 9.5  5.9  95 Upper arm - Wrist 24 50 ?49 28.1  L Ulnar - ADM     Wrist ADM 3.1 ?3.3 12.6 ?6.0 100 Wrist - ADM 7   40.2     B.Elbow ADM 7.3  11.5  91.2 B.Elbow - Wrist 23 55 ?49 39.0     MarcusElbow ADM 9.2  11.3  98.4 MarcusElbow - B.Elbow 10 52 ?49 37.4         MarcusElbow - Wrist      R Ulnar - ADM     Wrist ADM 3.1 ?3.3 11.8 ?6.0 100 Wrist - ADM 7   35.7     B.Elbow ADM 6.9  10.9  92.5 B.Elbow - Wrist 23 60 ?49 33.4     MarcusElbow ADM 8.8  10.7  97.8 MarcusElbow - B.Elbow 10 52 ?49 34.3  MarcusElbow - Wrist                 SNC    Nerve / Sites Rec. Site Peak Lat Ref.  Amp Ref. Segments Distance    ms ms V V  cm  L Median - Orthodromic (Dig II, Mid palm)     Dig II Wrist 4.3 ?3.4 6 ?10 Dig II - Wrist 13  R Median - Orthodromic (Dig II, Mid palm)     Dig II Wrist 4.2 ?3.4 10 ?10 Dig II - Wrist 13  L Ulnar - Orthodromic, (Dig V, Mid palm)     Dig V Wrist 3.0 ?3.1 5 ?5 Dig V - Wrist 11  R Ulnar - Orthodromic, (Dig V, Mid palm)     Dig V Wrist 2.8 ?3.1 5 ?5 Dig V - Wrist 99             F  Wave    Nerve F Lat Ref.   ms ms  L Ulnar - ADM 33.9 ?32.0  R Ulnar - ADM 34.1 ?32.0         EMG Summary Table    Spontaneous MUAP Recruitment  Muscle IA Fib PSW Fasc Other Amp Dur. Poly Pattern  L. Deltoid Normal None None None _______ Normal Normal 1+ Reduced  L. Triceps brachii Normal None None None _______ Normal Increased 1+ Reduced  L. Biceps brachii Normal None None None _______ Normal Normal Normal Normal  L.  Extensor digitorum communis Normal None None None _______ Normal Normal 1+ Reduced  L. Flexor carpi ulnaris Normal None None None _______ Normal Normal 1+ Normal  L. First dorsal interosseous Normal None None None _______ Normal Normal Normal Normal  L. Abductor pollicis brevis Normal None None None _______ Normal Normal Normal Normal  L. Supraspinatus Normal None None None _______ Normal Normal 1+ Reduced  R. Deltoid Normal None None None _______ Normal Normal Normal Normal  R. Triceps brachii Normal None None None _______ Normal Normal 1+ Reduced  R. Biceps brachii Normal None None None _______ Normal Normal Normal Normal  R. Extensor digitorum communis Normal None None None _______ Normal Increased 1+ Reduced  R. First dorsal interosseous Normal None None None _______ Normal Normal Normal Normal  R. Abductor pollicis brevis Normal None None None _______ Normal Normal Normal Normal  R. Flexor carpi ulnaris Normal None None None _______ Normal Normal 1+ Normal

## 2019-10-20 NOTE — Telephone Encounter (Signed)
Visit Follow-Up Question  [name], MD  Marcus Watson "[name]" 6 hours ago (8:01 AM)   We will contact you and arrange an endoscopy and dilation  You may stop the budesonide  [name], MD sent to Marcus Pel, RN  We should set him up for EGD and dilation for EoE and dysphagia   Can stop the budesonide

## 2019-10-20 NOTE — Telephone Encounter (Signed)
Patient returned your call.

## 2019-10-21 ENCOUNTER — Ambulatory Visit: Payer: BC Managed Care – PPO | Admitting: Clinical

## 2019-10-21 NOTE — Telephone Encounter (Signed)
Patient notified of the recommendations He is scheduled for EGD and pre-visit

## 2019-10-21 NOTE — Telephone Encounter (Signed)
Pt is returning your call from yesterday °

## 2019-10-26 ENCOUNTER — Encounter: Payer: Self-pay | Admitting: Neurology

## 2019-10-26 ENCOUNTER — Ambulatory Visit (AMBULATORY_SURGERY_CENTER): Payer: Self-pay | Admitting: *Deleted

## 2019-10-26 ENCOUNTER — Other Ambulatory Visit: Payer: Self-pay

## 2019-10-26 VITALS — Temp 97.5°F | Ht 71.5 in | Wt 218.0 lb

## 2019-10-26 DIAGNOSIS — R1310 Dysphagia, unspecified: Secondary | ICD-10-CM

## 2019-10-26 DIAGNOSIS — K200 Eosinophilic esophagitis: Secondary | ICD-10-CM

## 2019-10-26 NOTE — Progress Notes (Signed)
Patient states no changes in medical hx since GI OV.  Patient is here in-person for PV. Patient denies any allergies to eggs or soy. Patient denies any problems with anesthesia/sedation. Patient denies any oxygen use at home. Patient denies taking any diet/weight loss medications or blood thinners. Patient is not being treated for MRSA or C-diff. Patient is aware of our care-partner policy and 0000000 safety protocol. Covid vaccines completed on 09/21/19.

## 2019-10-27 DIAGNOSIS — F411 Generalized anxiety disorder: Secondary | ICD-10-CM | POA: Diagnosis not present

## 2019-10-27 DIAGNOSIS — F332 Major depressive disorder, recurrent severe without psychotic features: Secondary | ICD-10-CM | POA: Diagnosis not present

## 2019-10-28 ENCOUNTER — Encounter: Payer: Self-pay | Admitting: Internal Medicine

## 2019-10-28 DIAGNOSIS — R200 Anesthesia of skin: Secondary | ICD-10-CM | POA: Diagnosis not present

## 2019-10-28 DIAGNOSIS — M542 Cervicalgia: Secondary | ICD-10-CM | POA: Diagnosis not present

## 2019-10-28 DIAGNOSIS — M5020 Other cervical disc displacement, unspecified cervical region: Secondary | ICD-10-CM | POA: Diagnosis not present

## 2019-10-28 DIAGNOSIS — M5412 Radiculopathy, cervical region: Secondary | ICD-10-CM | POA: Diagnosis not present

## 2019-10-30 DIAGNOSIS — M5412 Radiculopathy, cervical region: Secondary | ICD-10-CM | POA: Diagnosis not present

## 2019-11-02 ENCOUNTER — Encounter: Payer: Self-pay | Admitting: Internal Medicine

## 2019-11-02 ENCOUNTER — Other Ambulatory Visit: Payer: Self-pay

## 2019-11-02 ENCOUNTER — Ambulatory Visit (AMBULATORY_SURGERY_CENTER): Payer: BC Managed Care – PPO | Admitting: Internal Medicine

## 2019-11-02 VITALS — BP 131/77 | HR 67 | Temp 97.2°F | Resp 18 | Ht 71.5 in | Wt 218.0 lb

## 2019-11-02 DIAGNOSIS — K200 Eosinophilic esophagitis: Secondary | ICD-10-CM | POA: Diagnosis not present

## 2019-11-02 DIAGNOSIS — R1310 Dysphagia, unspecified: Secondary | ICD-10-CM

## 2019-11-02 MED ORDER — SODIUM CHLORIDE 0.9 % IV SOLN: 500.0000 mL | Freq: Once | INTRAVENOUS | Status: DC

## 2019-11-02 NOTE — Patient Instructions (Addendum)
I took biopsies and dilated the esophagus as before.  Will call with results and plans.  If you have new swallowing problems after the C-spine surgery - those are usually transient and related to swelling that goes away.  I appreciate the opportunity to care for you. Marcus Mayer, MD, FACG      YOU HAD AN ENDOSCOPIC PROCEDURE TODAY AT [facility]:   Refer to the procedure report that was given to you for any specific questions about what was found during the examination.  If the procedure report does not answer your questions, please call your gastroenterologist to clarify.  If you requested that your care partner not be given the details of your procedure findings, then the procedure report has been included in a sealed envelope for you to review at your convenience later.  YOU SHOULD EXPECT: Some feelings of bloating in the abdomen. Passage of more gas than usual.  Walking can help get rid of the air that was put into your GI tract during the procedure and reduce the bloating. If you had a lower endoscopy (such as a colonoscopy or flexible sigmoidoscopy) you may notice spotting of blood in your stool or on the toilet paper. If you underwent a bowel prep for your procedure, you may not have a normal bowel movement for a few days.  Please Note:  You might notice some irritation and congestion in your nose or some drainage.  This is from the oxygen used during your procedure.  There is no need for concern and it should clear up in a day or so.  SYMPTOMS TO REPORT IMMEDIATELY:     Following upper endoscopy (EGD)  Vomiting of blood or coffee ground material  New chest pain or pain under the shoulder blades  Painful or persistently difficult swallowing  New shortness of breath  Fever of 100F or higher  Black, tarry-looking stools  For urgent or emergent issues, a gastroenterologist can be reached at any hour by calling [phone]. Do not use MyChart messaging for  urgent concerns.    DIET:  Please call if any questions or concerns.Please follow the dilatation diet the rest of today.  Hanout was provided. Drink plenty of fluids but you should avoid alcoholic beverages for 24 hours.  ACTIVITY:  You should plan to take it easy for the rest of today and you should NOT DRIVE or use heavy machinery until tomorrow (because of the sedation medicines used during the test).    FOLLOW UP: Our staff will call the number listed on your records 48-72 hours following your procedure to check on you and address any questions or concerns that you may have regarding the information given to you following your procedure. If we do not reach you, we will leave a message.  We will attempt to reach you two times.  During this call, we will ask if you have developed any symptoms of COVID 19. If you develop any symptoms (ie: fever, flu-like symptoms, shortness of breath, cough etc.) before then, please call [phone].  If you test positive for Covid 19 in the 2 weeks post procedure, please call and report this information to [place].    If any biopsies were taken you will be contacted by phone or by letter within the next 1-3 weeks.  Please call us at [phone] if you have not heard about the biopsies in 3 weeks.    SIGNATURES/CONFIDENTIALITY: You and/or your care partner have signed paperwork which  will be entered into your electronic medical record.  These signatures attest to the fact that that the information above on your After Visit Summary has been reviewed and is understood.  Full responsibility of the confidentiality of this discharge information lies with you and/or your care-partner.      You may resume your current medications today. Await biopsy results. Please call if any questions or concerns.

## 2019-11-02 NOTE — Progress Notes (Signed)
To PACU, VSS. Report to [email or web address] 

## 2019-11-02 NOTE — Progress Notes (Signed)
Called to room to assist during endoscopic procedure.  Patient ID and intended procedure confirmed with present staff. Received instructions for my participation in the procedure from the performing physician.  

## 2019-11-02 NOTE — Progress Notes (Signed)
Pt's states no medical or surgical changes since previsit or office visit. 

## 2019-11-02 NOTE — Op Note (Signed)
[facility] Patient Name: Marcus Watson Procedure Date: 11/02/2019 2:06 PM MRN: [id number] Endoscopist: [name] , MD Age: 59 Referring MD:  Date of Birth: [birth year] Gender: Male Account #: [id number] Procedure:                Upper GI endoscopy Indications:              Dysphagia, Eosinophilic esophagitis, Follow-up of                            eosinophilic esophagitis, For therapy of                            eosinophilic esophagitis No response to 2 weeks                            ingested topical budesonide Medicines:                Propofol per Anesthesia, Monitored Anesthesia Care Procedure:                Pre-Anesthesia Assessment:                           - Prior to the procedure, a History and Physical                            was performed, and patient medications and                            allergies were reviewed. The patient's tolerance of                            previous anesthesia was also reviewed. The risks                            and benefits of the procedure and the sedation                            options and risks were discussed with the patient.                            All questions were answered, and informed consent                            was obtained. Prior Anticoagulants: The patient has                            taken no previous anticoagulant or antiplatelet                            agents. ASA Grade Assessment: II - A patient with                            mild systemic disease. After reviewing the risks  and benefits, the patient was deemed in                            satisfactory condition to undergo the procedure.                           After obtaining informed consent, the endoscope was                            passed under direct vision. Throughout the                            procedure, the patient's blood pressure, pulse, and                            oxygen  saturations were monitored continuously. The                            Endoscope was introduced through the mouth, and                            advanced to the second part of duodenum. The upper                            GI endoscopy was accomplished without difficulty.                            The patient tolerated the procedure well. Scope In: Scope Out: Findings:                 Mucosal changes were found in the entire esophagus.                            Esophageal findings were graded using the                            Eosinophilic Esophagitis Endoscopic Reference Score                            (EoE-EREFS) as: Edema Grade 1 Present (decreased                            clarity or absence of vascular markings), Rings                            Grade 1 Mild (subtle circumferential ridges seen on                            esophageal distension), Exudates Grade 1 Mild                            (scattered white lesions involving less than 10  percent of the esophageal surface area), Furrows                            Grade 1 Present (vertical lines with or without                            visible depth) and Stricture present. Biopsies were                            obtained from the proximal and distal esophagus                            with cold forceps for histology of suspected                            eosinophilic esophagitis. Verification of patient                            identification for the specimen was done. Estimated                            blood loss was minimal. The scope was withdrawn.                            Dilation was performed with a Maloney dilator with                            mild resistance at 56 Fr. The dilation site was                            examined following endoscope reinsertion and showed                            mild mucosal disruption. Estimated blood loss was                             minimal.                           The entire examined stomach was normal.                           The examined duodenum was normal.                           The cardia and gastric fundus were normal on                            retroflexion. Complications:            No immediate complications. Estimated blood loss:                            Minimal. Estimated Blood Loss:     Estimated blood loss was minimal. Impression:               -  Esophageal mucosal changes secondary to                            eosinophilic esophagitis. Biopsied. Dilated.                            Slightly irregular z-line also                           - Normal stomach.                           - Normal examined duodenum. Recommendation:           - Patient has a contact number available for                            emergencies. The signs and symptoms of potential                            delayed complications were discussed with the                            patient. Return to normal activities tomorrow.                            Written discharge instructions were provided to the                            patient.                           - Clear liquids x 1 hour then soft foods rest of                            day. Start prior diet tomorrow.                           - Continue present medications.                           - Await pathology results. [name], MD 11/02/2019 2:35:14 PM This report has been signed electronically.

## 2019-11-02 NOTE — Progress Notes (Signed)
No problems noted in the recovery room. maw 

## 2019-11-02 NOTE — Progress Notes (Signed)
Temperature taken by L.S., VS taken by K.A.

## 2019-11-02 NOTE — Progress Notes (Signed)
Patient scheduled for cervical fusion 5/7, and carpal tunnel surgery 11/17/19, MD aware.

## 2019-11-04 ENCOUNTER — Ambulatory Visit: Payer: BC Managed Care – PPO | Admitting: Clinical

## 2019-11-04 ENCOUNTER — Telehealth: Payer: Self-pay

## 2019-11-04 NOTE — Telephone Encounter (Signed)
  Follow up Call-  Call back number 11/02/2019 05/29/2017  Post procedure Call Back phone  # [phone]  Permission to leave phone message Yes Yes  Some recent data might be hidden     Patient questions:  Do you have a fever, pain , or abdominal swelling? No. Pain Score  0 *  Have you tolerated food without any problems? Yes.    Have you been able to return to your normal activities? Yes.    Do you have any questions about your discharge instructions: Diet   No. Medications  No. Follow up visit  No.  Do you have questions or concerns about your Care? No.  Actions: * If pain score is 4 or above: No action needed, pain <4. 1. Have you developed a fever since your procedure? no  2.   Have you had an respiratory symptoms (SOB or cough) since your procedure? no  3.   Have you tested positive for COVID 19 since your procedure no  4.   Have you had any family members/close contacts diagnosed with the COVID 19 since your procedure?  no   If yes to any of these questions please route to Joylene John, RN and Erenest Rasher, RN

## 2019-11-10 DIAGNOSIS — Z1152 Encounter for screening for COVID-19: Secondary | ICD-10-CM | POA: Diagnosis not present

## 2019-11-10 DIAGNOSIS — F411 Generalized anxiety disorder: Secondary | ICD-10-CM | POA: Diagnosis not present

## 2019-11-17 DIAGNOSIS — M50222 Other cervical disc displacement at C5-C6 level: Secondary | ICD-10-CM | POA: Diagnosis not present

## 2019-11-17 DIAGNOSIS — M4722 Other spondylosis with radiculopathy, cervical region: Secondary | ICD-10-CM | POA: Diagnosis not present

## 2019-11-17 DIAGNOSIS — M4802 Spinal stenosis, cervical region: Secondary | ICD-10-CM | POA: Diagnosis not present

## 2019-11-17 DIAGNOSIS — G5602 Carpal tunnel syndrome, left upper limb: Secondary | ICD-10-CM | POA: Diagnosis not present

## 2019-11-17 DIAGNOSIS — M5020 Other cervical disc displacement, unspecified cervical region: Secondary | ICD-10-CM | POA: Diagnosis not present

## 2019-11-17 DIAGNOSIS — M50122 Cervical disc disorder at C5-C6 level with radiculopathy: Secondary | ICD-10-CM | POA: Diagnosis not present

## 2019-11-17 DIAGNOSIS — M50223 Other cervical disc displacement at C6-C7 level: Secondary | ICD-10-CM | POA: Diagnosis not present

## 2019-11-24 ENCOUNTER — Emergency Department (HOSPITAL_BASED_OUTPATIENT_CLINIC_OR_DEPARTMENT_OTHER)
Admission: EM | Admit: 2019-11-24 | Discharge: 2019-11-24 | Disposition: A | Discharge status: Home / Self Care | Payer: BC Managed Care – PPO | Attending: Emergency Medicine | Admitting: Emergency Medicine

## 2019-11-24 ENCOUNTER — Other Ambulatory Visit: Payer: Self-pay

## 2019-11-24 ENCOUNTER — Encounter (HOSPITAL_BASED_OUTPATIENT_CLINIC_OR_DEPARTMENT_OTHER): Payer: Self-pay

## 2019-11-24 ENCOUNTER — Emergency Department (HOSPITAL_BASED_OUTPATIENT_CLINIC_OR_DEPARTMENT_OTHER): Payer: BC Managed Care – PPO

## 2019-11-24 DIAGNOSIS — R05 Cough: Secondary | ICD-10-CM | POA: Diagnosis not present

## 2019-11-24 DIAGNOSIS — Z20822 Contact with and (suspected) exposure to covid-19: Secondary | ICD-10-CM | POA: Insufficient documentation

## 2019-11-24 DIAGNOSIS — J45909 Unspecified asthma, uncomplicated: Secondary | ICD-10-CM | POA: Insufficient documentation

## 2019-11-24 DIAGNOSIS — Z96651 Presence of right artificial knee joint: Secondary | ICD-10-CM | POA: Diagnosis not present

## 2019-11-24 DIAGNOSIS — Z7951 Long term (current) use of inhaled steroids: Secondary | ICD-10-CM | POA: Insufficient documentation

## 2019-11-24 DIAGNOSIS — Z79899 Other long term (current) drug therapy: Secondary | ICD-10-CM | POA: Diagnosis not present

## 2019-11-24 DIAGNOSIS — B338 Other specified viral diseases: Secondary | ICD-10-CM

## 2019-11-24 DIAGNOSIS — R519 Headache, unspecified: Secondary | ICD-10-CM | POA: Diagnosis not present

## 2019-11-24 DIAGNOSIS — Z87891 Personal history of nicotine dependence: Secondary | ICD-10-CM | POA: Diagnosis not present

## 2019-11-24 DIAGNOSIS — R509 Fever, unspecified: Secondary | ICD-10-CM | POA: Diagnosis not present

## 2019-11-24 DIAGNOSIS — F411 Generalized anxiety disorder: Secondary | ICD-10-CM | POA: Diagnosis not present

## 2019-11-24 DIAGNOSIS — B974 Respiratory syncytial virus as the cause of diseases classified elsewhere: Secondary | ICD-10-CM | POA: Diagnosis not present

## 2019-11-24 DIAGNOSIS — R0981 Nasal congestion: Secondary | ICD-10-CM | POA: Insufficient documentation

## 2019-11-24 DIAGNOSIS — Z85828 Personal history of other malignant neoplasm of skin: Secondary | ICD-10-CM | POA: Diagnosis not present

## 2019-11-24 DIAGNOSIS — J069 Acute upper respiratory infection, unspecified: Secondary | ICD-10-CM | POA: Diagnosis not present

## 2019-11-24 DIAGNOSIS — Z209 Contact with and (suspected) exposure to unspecified communicable disease: Secondary | ICD-10-CM | POA: Diagnosis not present

## 2019-11-24 LAB — RESP PANEL BY RT PCR (RSV, FLU A&B, COVID)
Influenza A by PCR: NEGATIVE
Influenza B by PCR: NEGATIVE
Respiratory Syncytial Virus by PCR: POSITIVE — AB
SARS Coronavirus 2 by RT PCR: NEGATIVE

## 2019-11-24 LAB — GROUP A STREP BY PCR: Group A Strep by PCR: NOT DETECTED

## 2019-11-24 MED ORDER — BENZONATATE 100 MG PO CAPS
100.0000 mg | ORAL_CAPSULE | Freq: Three times a day (TID) | ORAL | 0 refills | Status: AC
Start: 2019-11-24 — End: 2019-11-29

## 2019-11-24 MED ORDER — DOXYCYCLINE HYCLATE 100 MG PO CAPS: 100.0000 mg | ORAL_CAPSULE | Freq: Two times a day (BID) | ORAL | 0 refills | Status: AC

## 2019-11-24 NOTE — Discharge Instructions (Addendum)
Take tessalon as directed. Continue your nasal sprays at home.   You were given a prescription for antibiotics. Please take the antibiotic prescription fully.   Please follow up with your primary care provider within 5-7 days for re-evaluation of your symptoms. If you do not have a primary care provider, information for a healthcare clinic has been provided for you to make arrangements for follow up care. Please return to the emergency department for any new or worsening symptoms.

## 2019-11-24 NOTE — ED Triage Notes (Signed)
C/o flu like sx x 4 days-pt had contact with grandchild +RSV and sick family member-NAD-steady gait-pt wearing soft ccollar-pt had neck surgery and carpal tunnel 1 week ago-NAD-steady gait

## 2019-11-24 NOTE — ED Provider Notes (Signed)
[facility] EMERGENCY DEPARTMENT Provider Note   CSN: [id number] Arrival date & time: 11/24/19  1412     History Chief Complaint  Patient presents with  . Cough    Marcus Watson is a 59 y.o. male.  HPI   59 year old male with a history of eosinophilic esophagitis, ED, esophageal laceration, food impaction of the esophagus, GERD, melanoma, asthma, seasonal allergies, who presents to the emergency department today for evaluation of upper respiratory symptoms.  Patient was around his grandchild last week that was positive for RSV.  For the last 4 days he has had rhinorrhea, nasal congestion, sore throat, and a fever.  He also has a mild cough.  States he saw his PCP who wanted him to go to the ED and get checked for strep throat, RSV and Covid.  Additionally, patient had neck surgery and carpal tunnel surgery 1 week ago and does not have any symptoms in regard to those surgeries.  He further states that he has a history of multiple ENT surgeries and has had frequent episodes of bacterial sinusitis in the past.  Past Medical History:  Diagnosis Date  . Allergy   . Eosinophilic esophagitis 06/24/9148  . Erectile dysfunction   . Esophageal laceration 12/04/2016  . Food impaction of esophagus 12/04/2016  . GERD with stricture   . Melanoma ([place])    back  . Mild asthma   . Seasonal allergies   . Testosterone deficiency     Patient Active Problem List   Diagnosis Date Noted  . Numbness of arm 10/20/2019  . Cervical radiculopathy 10/20/2019  . Carpal tunnel syndrome, bilateral 10/20/2019  . Status post total right knee replacement 12/09/2017  . Cellulitis of right knee 11/26/2017  . Status post arthroscopy of right knee 07/10/2017  . Unilateral primary osteoarthritis, right knee 07/10/2017  . GERD with stricture 03/19/2017  . Pain in right hip 03/04/2017  . Acute pain of right knee 03/04/2017  . Trochanteric bursitis, right hip 03/04/2017  . Eosinophilic esophagitis  56/97/9480    Past Surgical History:  Procedure Laterality Date  . CARPAL TUNNEL RELEASE    . COLONOSCOPY  2015   in [place]  . ESOPHAGOGASTRODUODENOSCOPY N/A 12/04/2016   Procedure: ESOPHAGOGASTRODUODENOSCOPY (EGD);  Surgeon: [name], MD;  Location: [facility] ENDOSCOPY;  Service: Endoscopy;  Laterality: N/A;  . HERNIA REPAIR    . KNEE ARTHROCENTESIS    . KNEE ARTHROSCOPY Right   . NASAL SINUS SURGERY    . NECK SURGERY    . TONSILLECTOMY    . TOTAL KNEE ARTHROPLASTY Right 11/22/2017   Procedure: RIGHT TOTAL KNEE ARTHROPLASTY;  Surgeon: Mcarthur Rossetti, MD;  Location: [facility] ORS;  Service: Orthopedics;  Laterality: Right;  Adductor Block       Family History  Problem Relation Age of Onset  . Hypertension Other   . Colon cancer Neg Hx   . Esophageal cancer Neg Hx   . Rectal cancer Neg Hx   . Stomach cancer Neg Hx   . Colon polyps Neg Hx     Social History   Tobacco Use  . Smoking status: Never Smoker  . Smokeless tobacco: Former Network engineer Use Topics  . Alcohol use: Yes    Comment: occassional  . Drug use: No    Home Medications Prior to Admission medications   Medication Sig Start Date End Date Taking? Authorizing Provider  albuterol (PROVENTIL HFA;VENTOLIN HFA) 108 (90 Base) MCG/ACT inhaler Inhale 1 puff into the lungs as needed  for wheezing.  12/28/15 11/02/19  [provider]  AMBULATORY NON FORMULARY MEDICATION Medication Name: Budesonide suspension for Eosinophilic esophagitis  2 VZ/56 ml  Sig: 5 ml oral bid, nothing to drink or eat 30 mins after  Disp# 150 ml Patient not taking: Reported on 11/02/2019 10/05/19   [name], MD  benzonatate (TESSALON) 100 MG capsule Take 1 capsule (100 mg total) by mouth every 8 (eight) hours for 5 days. 11/24/19 11/29/19  Zakhari Fogel S, PA-C  buPROPion (WELLBUTRIN XL) 150 MG 24 hr tablet Take 150 mg by mouth at bedtime. 09/29/19   [provider]  Cholecalciferol (VITAMIN D-1000 MAX ST) 1000 units  tablet Take 1,000 Units by mouth daily.    [provider]  doxycycline (VIBRAMYCIN) 100 MG capsule Take 1 capsule (100 mg total) by mouth 2 (two) times daily for 7 days. 11/24/19 12/01/19  Victormanuel Mclure S, PA-C  DYMISTA 137-50 MCG/ACT SUSP Place 1 spray into both nostrils every morning.  11/30/16   [provider]  fexofenadine (ALLEGRA ALLERGY) 60 MG tablet Take 1 tablet by mouth daily. 05/18/18   [provider]  fluticasone-salmeterol (ADVAIR HFA) 115-21 MCG/ACT inhaler Inhale 2 puffs into the lungs 2 (two) times daily.    [provider]  hydrOXYzine (ATARAX/VISTARIL) 25 MG tablet Take 25-50 mg by mouth at bedtime as needed. 09/29/19   [provider]  Naphazoline-Glycerin-Zinc Sulf (CLEAR EYES SEASONAL RELIEF) 0.012-0.2-0.25 % SOLN Apply 1 drop to eye 2 (two) times daily as needed.    [provider]  pantoprazole (PROTONIX) 40 MG tablet Take 1 tablet (40 mg total) by mouth daily. 09/02/19   [name], MD  sildenafil (REVATIO) 20 MG tablet Take 20 mg by mouth daily as needed.  11/01/16   [provider]  testosterone cypionate (DEPO-TESTOSTERONE) 200 MG/ML injection Inject 200 mg into the muscle See admin instructions. Every two weeks 09/24/16   [provider]    Allergies    Penicillins, Grass extracts [gramineae pollens], and Other  Review of Systems   Review of Systems  Constitutional: Positive for fever.  HENT: Positive for congestion, rhinorrhea and sore throat.   Eyes: Negative for visual disturbance.  Respiratory: Positive for cough. Negative for shortness of breath.   Cardiovascular: Negative for chest pain.  Gastrointestinal: Negative for abdominal pain, constipation, diarrhea, nausea and vomiting.  Genitourinary: Negative for dysuria.  Musculoskeletal: Negative for gait problem.  Skin: Negative for color change.  Neurological: Negative for numbness.    Physical Exam Updated Vital Signs BP 127/82 (BP  Location: Right Arm)   Pulse 73   Temp 98.3 F (36.8 C) (Oral)   Resp 16   Ht 6' (1.829 m)   Wt 93 kg   SpO2 100%   BMI 27.82 kg/m   Physical Exam Vitals and nursing note reviewed.  Constitutional:      Appearance: He is well-developed.  HENT:     Head: Normocephalic and atraumatic.     Nose: Congestion present.     Mouth/Throat:     Pharynx: Uvula midline. No oropharyngeal exudate or posterior oropharyngeal erythema.     Tonsils: No tonsillar exudate or tonsillar abscesses. 0 on the right. 0 on the left.  Eyes:     Conjunctiva/sclera: Conjunctivae normal.  Cardiovascular:     Rate and Rhythm: Normal rate and regular rhythm.     Pulses: Normal pulses.     Heart sounds: Normal heart sounds. No murmur.  Pulmonary:  Effort: Pulmonary effort is normal. No respiratory distress.     Breath sounds: Normal breath sounds. No wheezing, rhonchi or rales.  Abdominal:     General: Bowel sounds are normal.     Palpations: Abdomen is soft.     Tenderness: There is no abdominal tenderness. There is no guarding or rebound.  Musculoskeletal:     Cervical back: Neck supple.  Skin:    General: Skin is warm and dry.  Neurological:     Mental Status: He is alert.     ED Results / Procedures / Treatments   Labs (all labs ordered are listed, but only abnormal results are displayed) Labs Reviewed  RESP PANEL BY RT PCR (RSV, FLU A&B, COVID) - Abnormal; Notable for the following components:      Result Value   Respiratory Syncytial Virus by PCR POSITIVE (*)    All other components within normal limits  GROUP A STREP BY PCR    EKG None  Radiology DG Chest Portable 1 View  Result Date: 11/24/2019 CLINICAL DATA:  Flu like symptoms for 4 days, cough EXAM: PORTABLE CHEST 1 VIEW COMPARISON:  None. FINDINGS: Single frontal view of the chest demonstrates an unremarkable cardiac silhouette. Subsegmental atelectasis or scarring at the left lung base. No airspace disease, effusion, or  pneumothorax. No acute bony abnormalities. IMPRESSION: 1. No acute intrathoracic process. Electronically Signed   By: Randa Ngo M.D.   On: 11/24/2019 16:02    Procedures Procedures (including critical care time)  Medications Ordered in ED Medications - No data to display  ED Course  I have reviewed the triage vital signs and the nursing notes.  Pertinent labs & imaging results that were available during my care of the patient were reviewed by me and considered in my medical decision making (see chart for details).    MDM Rules/Calculators/A&P                      Pt CXR negative for acute infiltrate. Patients symptoms are consistent with URI, and RSV swab is positive. COVID and flu are negative. Strep test is negative.  Discussed that it is more likely his upper respiratory symptoms are related to RSV however due to his frequent history of bacterial sinusitis will give Rx for doxycycline to help cover for this.  He states he has nasal sprays at home.  We will also give Rx for cough medication.  Pt will be discharged with symptomatic treatment.  Verbalizes understanding and is agreeable with plan. Pt is hemodynamically stable & in NAD prior to dc.  ----  Marcus Watson was evaluated in Emergency Department on 11/24/2019 for the symptoms described in the history of present illness. He was evaluated in the context of the global COVID-19 pandemic, which necessitated consideration that the patient might be at risk for infection with the SARS-CoV-2 virus that causes COVID-19. Institutional protocols and algorithms that pertain to the evaluation of patients at risk for COVID-19 are in a state of rapid change based on information released by regulatory bodies including the CDC and federal and state organizations. These policies and algorithms were followed during the patient's care in the ED.   Final Clinical Impression(s) / ED Diagnoses Final diagnoses:  RSV infection    Rx / DC  Orders ED Discharge Orders         Ordered    doxycycline (VIBRAMYCIN) 100 MG capsule  2 times daily     11/24/19 1713  benzonatate (TESSALON) 100 MG capsule  Every 8 hours     11/24/19 1713           Rodney Booze, PA-C 11/24/19 1714    Margette Fast, MD 11/24/19 1904

## 2019-12-01 DIAGNOSIS — F411 Generalized anxiety disorder: Secondary | ICD-10-CM | POA: Diagnosis not present

## 2019-12-02 ENCOUNTER — Other Ambulatory Visit: Payer: Self-pay

## 2019-12-02 MED ORDER — PANTOPRAZOLE SODIUM 40 MG PO TBEC: 40.0000 mg | DELAYED_RELEASE_TABLET | Freq: Every day | ORAL | 1 refills | Status: DC

## 2019-12-02 NOTE — Telephone Encounter (Signed)
Pantoprazole refilled as pharmacy requested. 

## 2019-12-09 DIAGNOSIS — M5412 Radiculopathy, cervical region: Secondary | ICD-10-CM | POA: Diagnosis not present

## 2019-12-22 DIAGNOSIS — F411 Generalized anxiety disorder: Secondary | ICD-10-CM | POA: Diagnosis not present

## 2019-12-22 DIAGNOSIS — F332 Major depressive disorder, recurrent severe without psychotic features: Secondary | ICD-10-CM | POA: Diagnosis not present

## 2019-12-24 DIAGNOSIS — Z630 Problems in relationship with spouse or partner: Secondary | ICD-10-CM | POA: Diagnosis not present

## 2019-12-29 DIAGNOSIS — F411 Generalized anxiety disorder: Secondary | ICD-10-CM | POA: Diagnosis not present

## 2020-01-06 DIAGNOSIS — Z630 Problems in relationship with spouse or partner: Secondary | ICD-10-CM | POA: Diagnosis not present

## 2020-01-13 DIAGNOSIS — Z630 Problems in relationship with spouse or partner: Secondary | ICD-10-CM | POA: Diagnosis not present

## 2020-01-18 DIAGNOSIS — M5020 Other cervical disc displacement, unspecified cervical region: Secondary | ICD-10-CM | POA: Diagnosis not present

## 2020-01-19 DIAGNOSIS — F411 Generalized anxiety disorder: Secondary | ICD-10-CM | POA: Diagnosis not present

## 2020-02-02 DIAGNOSIS — R7989 Other specified abnormal findings of blood chemistry: Secondary | ICD-10-CM | POA: Diagnosis not present

## 2020-02-02 DIAGNOSIS — N400 Enlarged prostate without lower urinary tract symptoms: Secondary | ICD-10-CM | POA: Diagnosis not present

## 2020-02-04 DIAGNOSIS — Z630 Problems in relationship with spouse or partner: Secondary | ICD-10-CM | POA: Diagnosis not present

## 2020-02-11 DIAGNOSIS — Z630 Problems in relationship with spouse or partner: Secondary | ICD-10-CM | POA: Diagnosis not present

## 2020-02-16 DIAGNOSIS — F411 Generalized anxiety disorder: Secondary | ICD-10-CM | POA: Diagnosis not present

## 2020-02-16 DIAGNOSIS — F332 Major depressive disorder, recurrent severe without psychotic features: Secondary | ICD-10-CM | POA: Diagnosis not present

## 2020-02-18 DIAGNOSIS — Z630 Problems in relationship with spouse or partner: Secondary | ICD-10-CM | POA: Diagnosis not present

## 2020-02-25 DIAGNOSIS — Z630 Problems in relationship with spouse or partner: Secondary | ICD-10-CM | POA: Diagnosis not present

## 2020-03-01 DIAGNOSIS — F411 Generalized anxiety disorder: Secondary | ICD-10-CM | POA: Diagnosis not present

## 2020-03-09 DIAGNOSIS — J00 Acute nasopharyngitis [common cold]: Secondary | ICD-10-CM | POA: Diagnosis not present

## 2020-03-09 DIAGNOSIS — H9201 Otalgia, right ear: Secondary | ICD-10-CM | POA: Diagnosis not present

## 2020-03-15 DIAGNOSIS — F411 Generalized anxiety disorder: Secondary | ICD-10-CM | POA: Diagnosis not present

## 2020-03-29 DIAGNOSIS — F332 Major depressive disorder, recurrent severe without psychotic features: Secondary | ICD-10-CM | POA: Diagnosis not present

## 2020-04-07 DIAGNOSIS — Z630 Problems in relationship with spouse or partner: Secondary | ICD-10-CM | POA: Diagnosis not present

## 2020-04-12 DIAGNOSIS — F411 Generalized anxiety disorder: Secondary | ICD-10-CM | POA: Diagnosis not present

## 2020-04-14 DIAGNOSIS — Z630 Problems in relationship with spouse or partner: Secondary | ICD-10-CM | POA: Diagnosis not present

## 2020-04-18 DIAGNOSIS — Z630 Problems in relationship with spouse or partner: Secondary | ICD-10-CM | POA: Diagnosis not present

## 2020-05-09 ENCOUNTER — Other Ambulatory Visit: Payer: Self-pay | Admitting: Internal Medicine

## 2020-05-10 DIAGNOSIS — F411 Generalized anxiety disorder: Secondary | ICD-10-CM | POA: Diagnosis not present

## 2020-05-18 ENCOUNTER — Encounter: Admit: 2020-05-18 | Discharge: 2020-05-18 | Payer: BC Managed Care – PPO

## 2020-05-18 ENCOUNTER — Ambulatory Visit: Admit: 2020-05-18 | Discharge: 2020-05-18 | Payer: BC Managed Care – PPO

## 2020-05-18 DIAGNOSIS — H5789 Other specified disorders of eye and adnexa: Secondary | ICD-10-CM

## 2020-05-19 NOTE — Progress Notes
Date of Service: 05/18/2020    Maurice Copeland is a 59 y.o. male.  DOB: 17-Apr-1961  MRN: [id number]     Subjective:             History of Present Illness  Chief Complaint   Patient presents with    Red Eye     Did virtual visit through [employer] site yesterday, was started on tobramycin eye drops which have not helped and the eye has gotten worse    Onset 2 days ago of left eye redness with itching and painful. Now with increased redness and increased pain. He has noted a white ridge around the iris on the inside aspect       Review of Systems      Objective:          azelastine (ASTELIN) 137 mcg (0.1 %) nasal spray Apply 2 sprays to each nostril as directed twice daily. Use in each nostril as directed    azelastine-fluticasone (DYMISTA) 137-50 mcg/spray nasal spray Apply  to each nostril as directed twice daily.    budesonide (PULMICORT) 0.25 mg/2 mL nebulizer solution Inhale 0.25 mg solution by nebulizer as directed twice daily.    bupropion HBr (APLENZIN) 174 mg ER tablet Take 174 mg by mouth daily.    cetirizine HCl (ZYRTEC PO) Take  by mouth.    pantoprazole DR (PROTONIX) 20 mg tablet Take 20 mg by mouth daily.     Vitals:    05/18/20 1829   BP: 120/88   BP Source: Arm, Left Upper   Patient Position: Sitting   Pulse: 74   Resp: 18   Temp: 36.9 C (98.4 F)   TempSrc: Temporal   SpO2: 98%   Weight: 95.3 kg (210 lb)   Height: 185.4 cm (73")     Body mass index is 27.71 kg/m.     Physical Exam  Eyes:        Comments: Left eye with redness and white ridge as noted in graphic   No discharge                Assessment and Plan:  Maurice Copeland was seen today for red eye.    Diagnoses and all orders for this visit:    Red eye      Due to concern for iritis or other severe eye concerns, patient was referred to the emergency department for further evaluation and treatment.

## 2020-05-23 DIAGNOSIS — G5603 Carpal tunnel syndrome, bilateral upper limbs: Secondary | ICD-10-CM | POA: Diagnosis not present

## 2020-05-23 DIAGNOSIS — M5412 Radiculopathy, cervical region: Secondary | ICD-10-CM | POA: Diagnosis not present

## 2020-05-23 DIAGNOSIS — G5622 Lesion of ulnar nerve, left upper limb: Secondary | ICD-10-CM | POA: Diagnosis not present

## 2020-05-23 DIAGNOSIS — M542 Cervicalgia: Secondary | ICD-10-CM | POA: Diagnosis not present

## 2020-05-24 DIAGNOSIS — F332 Major depressive disorder, recurrent severe without psychotic features: Secondary | ICD-10-CM | POA: Diagnosis not present

## 2020-05-25 DIAGNOSIS — H16142 Punctate keratitis, left eye: Secondary | ICD-10-CM | POA: Diagnosis not present

## 2020-05-25 DIAGNOSIS — H20012 Primary iridocyclitis, left eye: Secondary | ICD-10-CM | POA: Diagnosis not present

## 2020-06-13 IMAGING — DX DG CHEST 1V PORT
1 series · 1 of 1 positions shown · non-contrast
Comparison: None.

CLINICAL DATA: Flu like symptoms for 4 days, cough

EXAM:
PORTABLE CHEST 1 VIEW

[chest ap]
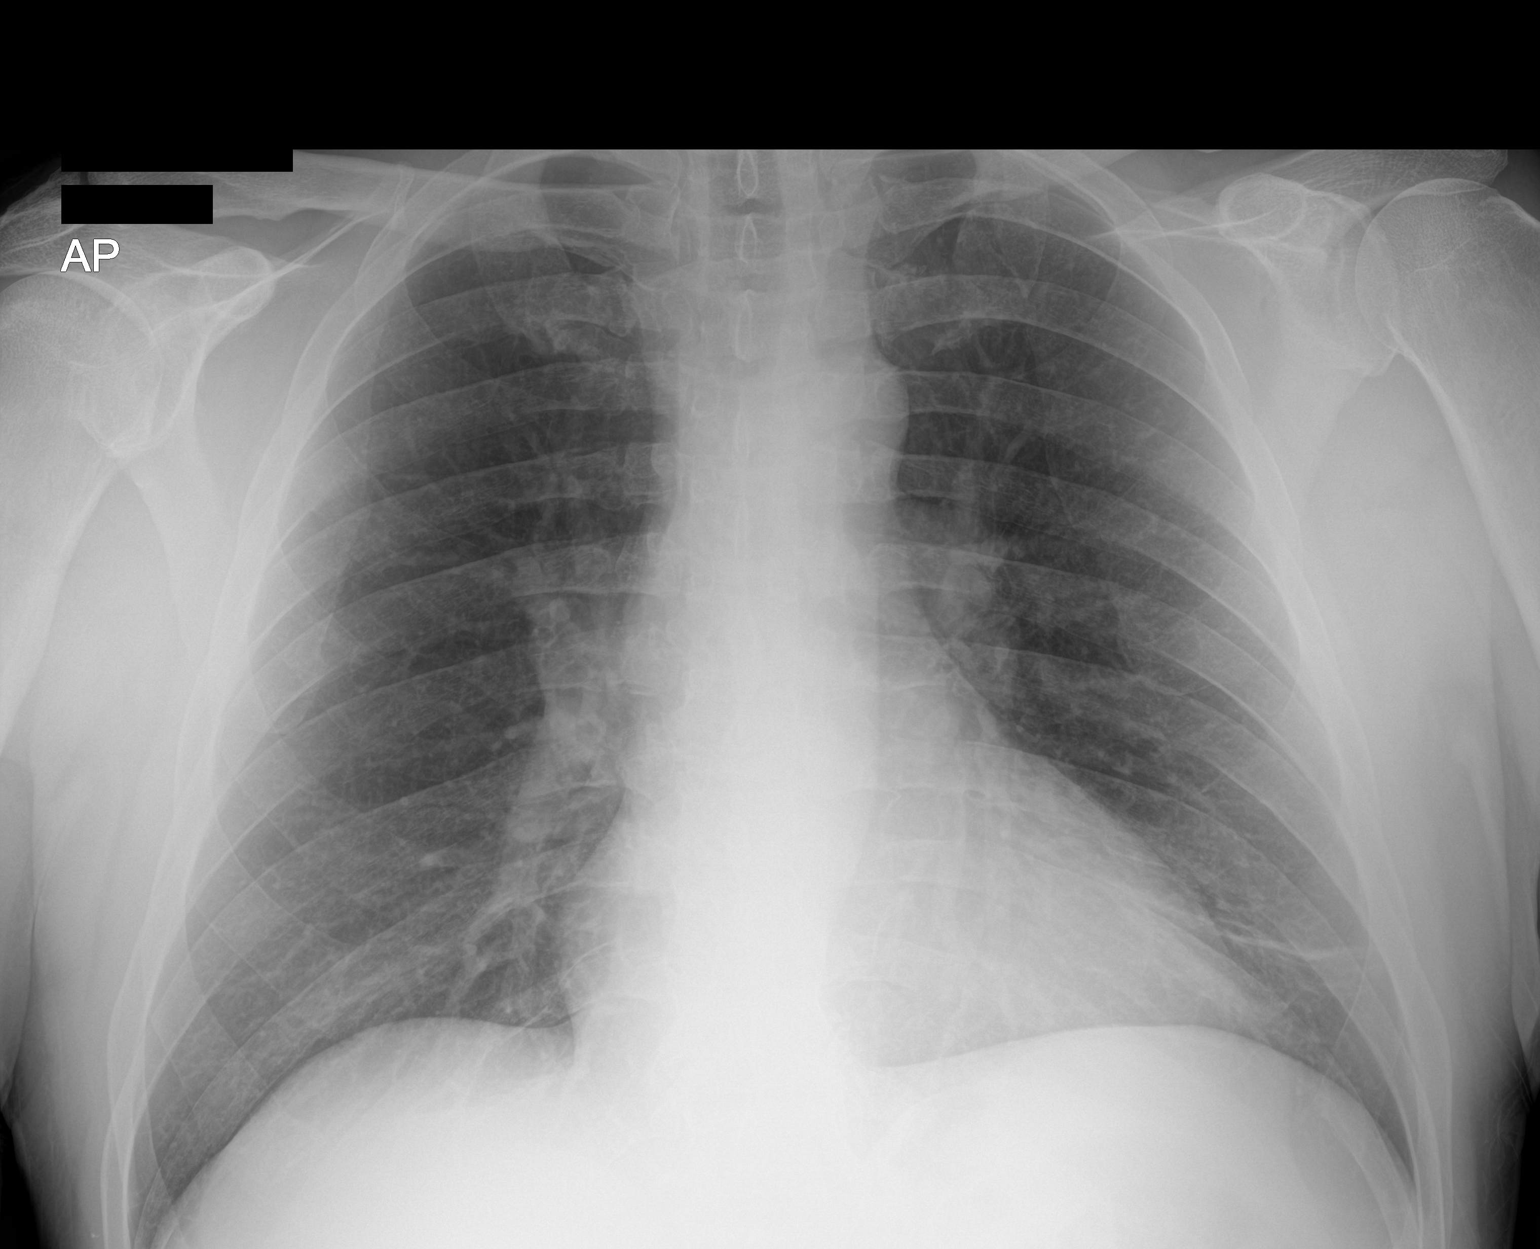

[1 of 1 positions shown; findings below may reference images not displayed]

FINDINGS: Single frontal view of the chest demonstrates an unremarkable
cardiac silhouette. Subsegmental atelectasis or scarring at the left
lung base. No airspace disease, effusion, or pneumothorax. No acute
bony abnormalities.
IMPRESSION: 1. No acute intrathoracic process.

## 2020-07-18 DIAGNOSIS — F332 Major depressive disorder, recurrent severe without psychotic features: Secondary | ICD-10-CM | POA: Diagnosis not present

## 2020-08-18 ENCOUNTER — Telehealth: Payer: Self-pay

## 2020-08-18 NOTE — Telephone Encounter (Signed)
Per patient's [employer] message records have been sent for him to get established at a new GI practice in [place]. Dr B. Lindley Magnus, phone # [phone]. Fax # [phone]. Greg informed via [facility] that this has been done.

## 2021-04-19 ENCOUNTER — Encounter: Admit: 2021-04-19 | Discharge: 2021-04-19 | Payer: BC Managed Care – PPO

## 2023-05-01 ENCOUNTER — Encounter: Payer: Self-pay | Admitting: Psychiatry

## 2023-11-06 ENCOUNTER — Encounter: Admit: 2023-11-06 | Discharge: 2023-11-06 | Payer: BLUE CROSS/BLUE SHIELD
